# Patient Record
Sex: Female | Born: 1975 | Race: Black or African American | Hispanic: No | Marital: Married | Smoking: Current every day smoker
Health system: Southern US, Community
[De-identification: ages and names within clinical notes are randomized; demographics above are authoritative.]

## PROBLEM LIST (undated history)

## (undated) HISTORY — PX: CHOLECYSTECTOMY: SHX55

## (undated) HISTORY — PX: APPENDECTOMY: SHX54

---

## 1998-05-06 ENCOUNTER — Inpatient Hospital Stay (HOSPITAL_COMMUNITY): Admission: AD | Admit: 1998-05-06 | Discharge: 1998-05-09 | Payer: Self-pay | Admitting: Obstetrics

## 1998-05-07 ENCOUNTER — Encounter: Payer: Self-pay | Admitting: Obstetrics

## 1998-05-20 ENCOUNTER — Inpatient Hospital Stay (HOSPITAL_COMMUNITY): Admission: AD | Admit: 1998-05-20 | Discharge: 1998-05-20 | Payer: Self-pay | Admitting: *Deleted

## 1998-07-08 ENCOUNTER — Ambulatory Visit (HOSPITAL_COMMUNITY): Admission: RE | Admit: 1998-07-08 | Discharge: 1998-07-08 | Payer: Self-pay | Admitting: *Deleted

## 1998-07-16 ENCOUNTER — Inpatient Hospital Stay (HOSPITAL_COMMUNITY): Admission: AD | Admit: 1998-07-16 | Discharge: 1998-07-16 | Payer: Self-pay | Admitting: Obstetrics & Gynecology

## 1998-08-19 ENCOUNTER — Inpatient Hospital Stay (HOSPITAL_COMMUNITY): Admission: AD | Admit: 1998-08-19 | Discharge: 1998-08-19 | Payer: Self-pay | Admitting: *Deleted

## 1998-09-15 ENCOUNTER — Inpatient Hospital Stay (HOSPITAL_COMMUNITY): Admission: AD | Admit: 1998-09-15 | Discharge: 1998-09-15 | Payer: Self-pay | Admitting: *Deleted

## 1998-09-16 ENCOUNTER — Inpatient Hospital Stay (HOSPITAL_COMMUNITY): Admission: AD | Admit: 1998-09-16 | Discharge: 1998-09-16 | Payer: Self-pay | Admitting: Obstetrics

## 1998-09-17 ENCOUNTER — Inpatient Hospital Stay (HOSPITAL_COMMUNITY): Admission: AD | Admit: 1998-09-17 | Discharge: 1998-09-17 | Payer: Self-pay | Admitting: Obstetrics

## 1998-10-03 ENCOUNTER — Inpatient Hospital Stay (HOSPITAL_COMMUNITY): Admission: AD | Admit: 1998-10-03 | Discharge: 1998-10-03 | Payer: Self-pay | Admitting: *Deleted

## 1998-10-19 ENCOUNTER — Inpatient Hospital Stay (HOSPITAL_COMMUNITY): Admission: AD | Admit: 1998-10-19 | Discharge: 1998-10-19 | Payer: Self-pay | Admitting: Obstetrics & Gynecology

## 1998-11-04 ENCOUNTER — Inpatient Hospital Stay (HOSPITAL_COMMUNITY): Admission: AD | Admit: 1998-11-04 | Discharge: 1998-11-06 | Payer: Self-pay | Admitting: *Deleted

## 1999-04-26 ENCOUNTER — Emergency Department (HOSPITAL_COMMUNITY): Admission: EM | Admit: 1999-04-26 | Discharge: 1999-04-26 | Payer: Self-pay | Admitting: Emergency Medicine

## 1999-06-03 ENCOUNTER — Inpatient Hospital Stay (HOSPITAL_COMMUNITY): Admission: EM | Admit: 1999-06-03 | Discharge: 1999-06-04 | Payer: Self-pay | Admitting: Emergency Medicine

## 1999-06-03 ENCOUNTER — Encounter (INDEPENDENT_AMBULATORY_CARE_PROVIDER_SITE_OTHER): Payer: Self-pay | Admitting: *Deleted

## 1999-08-16 ENCOUNTER — Emergency Department (HOSPITAL_COMMUNITY): Admission: EM | Admit: 1999-08-16 | Discharge: 1999-08-16 | Payer: Self-pay | Admitting: Emergency Medicine

## 1999-12-07 ENCOUNTER — Emergency Department (HOSPITAL_COMMUNITY): Admission: EM | Admit: 1999-12-07 | Discharge: 1999-12-07 | Payer: Self-pay | Admitting: Emergency Medicine

## 2000-07-02 ENCOUNTER — Inpatient Hospital Stay (HOSPITAL_COMMUNITY): Admission: EM | Admit: 2000-07-02 | Discharge: 2000-07-04 | Payer: Self-pay | Admitting: Emergency Medicine

## 2000-07-02 ENCOUNTER — Encounter (INDEPENDENT_AMBULATORY_CARE_PROVIDER_SITE_OTHER): Payer: Self-pay

## 2000-07-02 ENCOUNTER — Encounter: Payer: Self-pay | Admitting: Emergency Medicine

## 2002-10-16 ENCOUNTER — Emergency Department (HOSPITAL_COMMUNITY): Admission: EM | Admit: 2002-10-16 | Discharge: 2002-10-16 | Payer: Self-pay | Admitting: Emergency Medicine

## 2003-01-28 ENCOUNTER — Emergency Department (HOSPITAL_COMMUNITY): Admission: EM | Admit: 2003-01-28 | Discharge: 2003-01-28 | Payer: Self-pay | Admitting: Emergency Medicine

## 2004-01-04 ENCOUNTER — Encounter (INDEPENDENT_AMBULATORY_CARE_PROVIDER_SITE_OTHER): Payer: Self-pay | Admitting: Specialist

## 2004-01-04 ENCOUNTER — Emergency Department (HOSPITAL_COMMUNITY): Admission: EM | Admit: 2004-01-04 | Discharge: 2004-01-04 | Payer: Self-pay | Admitting: Emergency Medicine

## 2004-01-05 ENCOUNTER — Inpatient Hospital Stay (HOSPITAL_COMMUNITY): Admission: AD | Admit: 2004-01-05 | Discharge: 2004-01-05 | Payer: Self-pay | Admitting: Obstetrics

## 2004-03-15 ENCOUNTER — Ambulatory Visit (HOSPITAL_COMMUNITY): Admission: RE | Admit: 2004-03-15 | Discharge: 2004-03-15 | Payer: Self-pay | Admitting: Obstetrics & Gynecology

## 2004-05-23 ENCOUNTER — Inpatient Hospital Stay (HOSPITAL_COMMUNITY): Admission: AD | Admit: 2004-05-23 | Discharge: 2004-05-26 | Payer: Self-pay | Admitting: Obstetrics & Gynecology

## 2004-06-08 ENCOUNTER — Inpatient Hospital Stay (HOSPITAL_COMMUNITY): Admission: AD | Admit: 2004-06-08 | Discharge: 2004-06-12 | Payer: Self-pay | Admitting: *Deleted

## 2004-07-20 ENCOUNTER — Encounter (INDEPENDENT_AMBULATORY_CARE_PROVIDER_SITE_OTHER): Payer: Self-pay | Admitting: Specialist

## 2004-07-20 ENCOUNTER — Inpatient Hospital Stay (HOSPITAL_COMMUNITY): Admission: AD | Admit: 2004-07-20 | Discharge: 2004-07-23 | Payer: Self-pay | Admitting: Obstetrics

## 2005-02-10 ENCOUNTER — Emergency Department (HOSPITAL_COMMUNITY): Admission: EM | Admit: 2005-02-10 | Discharge: 2005-02-10 | Payer: Self-pay | Admitting: Emergency Medicine

## 2005-05-04 ENCOUNTER — Inpatient Hospital Stay (HOSPITAL_COMMUNITY): Admission: AD | Admit: 2005-05-04 | Discharge: 2005-05-04 | Payer: Self-pay | Admitting: Obstetrics & Gynecology

## 2005-07-06 ENCOUNTER — Ambulatory Visit (HOSPITAL_COMMUNITY): Admission: RE | Admit: 2005-07-06 | Discharge: 2005-07-06 | Payer: Self-pay | Admitting: Obstetrics & Gynecology

## 2005-09-05 ENCOUNTER — Inpatient Hospital Stay (HOSPITAL_COMMUNITY): Admission: AD | Admit: 2005-09-05 | Discharge: 2005-09-05 | Payer: Self-pay | Admitting: Obstetrics

## 2005-10-07 ENCOUNTER — Inpatient Hospital Stay (HOSPITAL_COMMUNITY): Admission: AD | Admit: 2005-10-07 | Discharge: 2005-10-09 | Payer: Self-pay | Admitting: Obstetrics

## 2006-04-30 ENCOUNTER — Emergency Department (HOSPITAL_COMMUNITY): Admission: EM | Admit: 2006-04-30 | Discharge: 2006-04-30 | Payer: Self-pay | Admitting: Emergency Medicine

## 2006-05-03 ENCOUNTER — Emergency Department (HOSPITAL_COMMUNITY): Admission: EM | Admit: 2006-05-03 | Discharge: 2006-05-03 | Payer: Self-pay | Admitting: Family Medicine

## 2006-05-05 ENCOUNTER — Emergency Department (HOSPITAL_COMMUNITY): Admission: EM | Admit: 2006-05-05 | Discharge: 2006-05-05 | Payer: Self-pay | Admitting: Emergency Medicine

## 2006-06-13 ENCOUNTER — Emergency Department (HOSPITAL_COMMUNITY): Admission: EM | Admit: 2006-06-13 | Discharge: 2006-06-13 | Payer: Self-pay | Admitting: Emergency Medicine

## 2006-06-22 ENCOUNTER — Emergency Department (HOSPITAL_COMMUNITY): Admission: EM | Admit: 2006-06-22 | Discharge: 2006-06-22 | Payer: Self-pay | Admitting: Emergency Medicine

## 2007-01-05 ENCOUNTER — Emergency Department (HOSPITAL_COMMUNITY): Admission: EM | Admit: 2007-01-05 | Discharge: 2007-01-05 | Payer: Self-pay | Admitting: Emergency Medicine

## 2007-06-17 ENCOUNTER — Emergency Department (HOSPITAL_COMMUNITY): Admission: EM | Admit: 2007-06-17 | Discharge: 2007-06-17 | Payer: Self-pay | Admitting: Emergency Medicine

## 2008-09-08 ENCOUNTER — Ambulatory Visit: Payer: Self-pay | Admitting: Family Medicine

## 2008-09-08 ENCOUNTER — Inpatient Hospital Stay (HOSPITAL_COMMUNITY): Admission: AD | Admit: 2008-09-08 | Discharge: 2008-09-08 | Payer: Self-pay | Admitting: Obstetrics & Gynecology

## 2008-09-29 ENCOUNTER — Inpatient Hospital Stay (HOSPITAL_COMMUNITY): Admission: AD | Admit: 2008-09-29 | Discharge: 2008-09-29 | Payer: Self-pay | Admitting: Family Medicine

## 2008-09-29 ENCOUNTER — Ambulatory Visit: Payer: Self-pay | Admitting: Family Medicine

## 2008-10-22 ENCOUNTER — Inpatient Hospital Stay (HOSPITAL_COMMUNITY): Admission: AD | Admit: 2008-10-22 | Discharge: 2008-10-22 | Payer: Self-pay | Admitting: Obstetrics & Gynecology

## 2008-10-22 ENCOUNTER — Ambulatory Visit: Payer: Self-pay | Admitting: Obstetrics and Gynecology

## 2008-11-18 ENCOUNTER — Inpatient Hospital Stay (HOSPITAL_COMMUNITY): Admission: AD | Admit: 2008-11-18 | Discharge: 2008-11-21 | Payer: Self-pay | Admitting: Obstetrics & Gynecology

## 2008-11-18 ENCOUNTER — Ambulatory Visit: Payer: Self-pay | Admitting: Family

## 2010-08-07 ENCOUNTER — Encounter: Payer: Self-pay | Admitting: Obstetrics

## 2010-08-07 ENCOUNTER — Encounter: Payer: Self-pay | Admitting: Obstetrics & Gynecology

## 2010-10-26 LAB — CBC
HCT: 27.4 % — ABNORMAL LOW (ref 36.0–46.0)
HCT: 32 % — ABNORMAL LOW (ref 36.0–46.0)
Hemoglobin: 10.7 g/dL — ABNORMAL LOW (ref 12.0–15.0)
Hemoglobin: 9.3 g/dL — ABNORMAL LOW (ref 12.0–15.0)
MCHC: 33.5 g/dL (ref 30.0–36.0)
MCHC: 34 g/dL (ref 30.0–36.0)
MCV: 93.2 fL (ref 78.0–100.0)
MCV: 93.7 fL (ref 78.0–100.0)
Platelets: 219 10*3/uL (ref 150–400)
Platelets: 263 10*3/uL (ref 150–400)
RBC: 2.94 MIL/uL — ABNORMAL LOW (ref 3.87–5.11)
RBC: 3.41 MIL/uL — ABNORMAL LOW (ref 3.87–5.11)
RDW: 13.8 % (ref 11.5–15.5)
RDW: 14 % (ref 11.5–15.5)
WBC: 12 10*3/uL — ABNORMAL HIGH (ref 4.0–10.5)
WBC: 12.9 10*3/uL — ABNORMAL HIGH (ref 4.0–10.5)

## 2010-10-26 LAB — RAPID HIV SCREEN (WH-MAU): Rapid HIV Screen: NONREACTIVE

## 2010-10-26 LAB — RPR: RPR Ser Ql: NONREACTIVE

## 2010-10-26 LAB — RUBELLA SCREEN: Rubella: 20 IU/mL — ABNORMAL HIGH

## 2010-10-26 LAB — TYPE AND SCREEN
ABO/RH(D): O POS
Antibody Screen: NEGATIVE

## 2010-10-26 LAB — HEPATITIS B SURFACE ANTIGEN: Hepatitis B Surface Ag: NEGATIVE

## 2010-10-27 LAB — URINALYSIS, ROUTINE W REFLEX MICROSCOPIC
Bilirubin Urine: NEGATIVE
Glucose, UA: NEGATIVE mg/dL
Ketones, ur: NEGATIVE mg/dL
Leukocytes, UA: NEGATIVE
Nitrite: NEGATIVE
Protein, ur: NEGATIVE mg/dL
Specific Gravity, Urine: 1.02 (ref 1.005–1.030)
Urobilinogen, UA: 0.2 mg/dL (ref 0.0–1.0)
pH: 6.5 (ref 5.0–8.0)

## 2010-10-27 LAB — URINE MICROSCOPIC-ADD ON

## 2010-10-28 LAB — URINALYSIS, ROUTINE W REFLEX MICROSCOPIC
Bilirubin Urine: NEGATIVE
Glucose, UA: NEGATIVE mg/dL
Hgb urine dipstick: NEGATIVE
Ketones, ur: NEGATIVE mg/dL
Nitrite: NEGATIVE
Protein, ur: NEGATIVE mg/dL
Specific Gravity, Urine: 1.01 (ref 1.005–1.030)
Urobilinogen, UA: 0.2 mg/dL (ref 0.0–1.0)
pH: 7 (ref 5.0–8.0)

## 2010-10-28 LAB — RAPID URINE DRUG SCREEN, HOSP PERFORMED
Amphetamines: NOT DETECTED
Barbiturates: NOT DETECTED
Benzodiazepines: NOT DETECTED
Cocaine: NOT DETECTED
Opiates: NOT DETECTED
Tetrahydrocannabinol: NOT DETECTED

## 2010-10-28 LAB — URINE MICROSCOPIC-ADD ON

## 2010-10-28 LAB — GC/CHLAMYDIA PROBE AMP, GENITAL: GC Probe Amp, Genital: NEGATIVE

## 2010-10-28 LAB — WET PREP, GENITAL: Clue Cells Wet Prep HPF POC: NONE SEEN

## 2010-11-02 LAB — URINALYSIS, ROUTINE W REFLEX MICROSCOPIC
Ketones, ur: NEGATIVE mg/dL
Nitrite: NEGATIVE
pH: 6.5 (ref 5.0–8.0)

## 2010-11-02 LAB — GC/CHLAMYDIA PROBE AMP, GENITAL: Chlamydia, DNA Probe: POSITIVE — AB

## 2010-11-30 NOTE — Op Note (Signed)
NAME:  TESIA, LYBRAND NO.:  [id number]   MEDICAL RECORD NO.:  [id number]          PATIENT TYPE:  INP   LOCATION:  9111                          FACILITY:  [facility]   PHYSICIAN:  Lesly Dukes, M.D. DATE OF BIRTH:  1976-03-03   DATE OF PROCEDURE:  11/20/2008  DATE OF DISCHARGE:                               OPERATIVE REPORT   PREOPERATIVE DIAGNOSIS:  Undesired fertility.   POSTOPERATIVE DIAGNOSIS:  Undesired fertility.   PROCEDURE:  Bilateral tubal sterilization with Filshie clips.   SURGEON:  Lesly Dukes, MD   ASSISTANT:  Odie Sera, DO   ANESTHESIA:  Spinal and local.   REASON FOR PROCEDURE:  Ms. Ayana Imhof is a 35 year old gravida 6,  para 6, who is status post normal spontaneous vaginal delivery 1 day  ago.  She previously requested a bilateral tubal sterilization procedure  and then consented on the chart.  She had been counseled on the risks  and benefits of this procedure to include, but not limited to bleeding,  infection, and damage to internal organs.  Additionally, she has been  counseled on the failure rate of the procedure to be approximately 1 in  5000 and the increased risk of ectopic pregnancy should the failure  occur.  The patient voiced understanding of these risks and failure  rates and desired to proceed with the procedure.   DESCRIPTION OF PROCEDURE:  The patient was taken to the operating room  where epidural anesthesia was rebolused.  She was then prepped and  draped in the usual sterile manner.  A time-out was conducted.  The  umbilicus was anesthetized with 0.25% Marcaine without epinephrine.  A  vertical incision was made through the umbilicus and extended down in  the fascia.  The fascia was then incised in the midline with a scalpel.  The fascial incision was extended superiorly and inferiorly with Mayo  scissors.  The peritoneum was entered bluntly with a Kelly clamp.  The  peritoneal opening was extended with manual  traction.  Using retractors,  the left fallopian tube was identified and grasped with Babcock clamp.  It was traced to the end.  The fimbriated end was identified.  A  moderate left hydrosalpinx was noted.  The fallopian tube was then  traced proximally and a Filshie clip was placed approximately 2 cm from  the cornum of the uterus.  Attention was then brought to the right  fallopian tube, was identified and grasped with a Babcock clamp.  It was  then traced distally until identification of the fimbriated end.  I was  then traced proximally and a Filshie clip was placed approximately 2 cm  from the cornum of the uterus.  The tubes were then returned to the  abdomen.  The fascia was then closed using 0 Vicryl in a running non-  interlocking fashion.  The skin was then closed using 4-0 Vicryl  subcuticular manner.  All sponge, instrument, and needle counts were  correct x2.  The patient tolerated the procedure well.  There are  minimal blood loss.  The patient was taken to the  PACU in good  condition.   FINDINGS:  Bilateral fallopian tubes grossly normal and left  hydrosalpinx.   SPECIMENS:  None.      Odie Sera, DO  Electronically Signed     ______________________________  Lesly Dukes, M.D.    MC/MEDQ  D:  11/20/2008  T:  11/21/2008  Job:  [id number]

## 2010-12-03 NOTE — H&P (Signed)
[facility]  Patient:    Krista Obrien                         MRN: [id number] Adm. Date:  [id number] Attending:  Arlis Porta                         History and Physical  CHIEF COMPLAINT:  Persistent and worsening right upper quadrant pain.  HISTORY OF PRESENT ILLNESS:  This is a 35 year old female who has had intermittent episodes of right upper quadrant pain for the past 2-3 weeks.  She says today it is becoming worse and is constant.  She tried Pepcid AC and even drinking some Hawaiian Punch, but this did not offer her any relief.  She subsequently presented to the emergency department for evaluation.  She did not have any nausea, vomiting, fever, or chills.  She has three children, one by cesarean section.  PAST MEDICAL HISTORY:  No chronic illnesses.  PREVIOUS OPERATIONS:  Cesarean section.  ALLERGIES:  None reported.  MEDICATIONS:  None reported.  SOCIAL HISTORY:  She smokes a pack of cigarette a day.  She denies alcohol use.  FAMILY HISTORY:  No heart disease, diabetes mellitus, hypertension, or cancer.  REVIEW OF SYSTEMS:  Cardiovascular:  No known heart disease or hypertension. Pulmonary:  No asthma or pneumonia.  GI:  No peptic ulcer disease, hepatitis, diverticulitis.  GU: No kidney stones.  Endocrine:  No thyroid disease or diabetes mellitus.  Neurologic:  No strokes or seizures.  Hematologic:  No bleeding disorders, transfusions, or sickle cell trait.  LABORATORY DATA:  Her CBC is normal as are her liver function tests.  Urine pregnancy test is negative and urinalysis is essentially unremarkable.  Abdominal ultrasound demonstrates several large gallstones in the gallbladder with early inflammatory changes.  PHYSICAL EXAMINATION:  GENERAL:  She is a thin female.  Appears to be uncomfortable, sitting up in the  bed.  VITAL SIGNS:  She is afebrile with normal blood pressure and heart rate.  HEENT:   Normocephalic, atraumatic.  EOMI.  Sclerae are clear.  NECK:  Supple without mass.  HEART:  Regular rate and rhythm with a soft murmur.  LUNGS:  Equal breath sounds.  Clear to auscultation.  ABDOMEN:  Soft with right upper quadrant tenderness and guarding.  There are no  palpable masses.  A few bowel sounds are present.  EXTREMITIES:  No edema.  IMPRESSION:  Early acute calculus cholecystitis.  PLAN:  Laparoscopic cholecystectomy.  I did explain the procedure and the risks including, but not limited to bleeding, infection, common bile duct injury, bile leak, and the risk of the anesthetic. She seems to understand all these and agrees to proceed. DD:  06/03/99 TD:  06/03/99 Job: [id number]

## 2010-12-03 NOTE — H&P (Signed)
[facility]  Patient:    Krista Obrien, Krista Obrien                        MRN: [id number] Adm. Date:  [id number] Attending:  Bonnetta Barry                         History and Physical  REFERRING PHYSICIAN:  Dr. Earlyne Iba.  REASON FOR ADMISSION:  Acute appendicitis.  BRIEF HISTORY:  Patient is a 35 year old black female who presents to the emergency department with a 12-hour history of lower abdominal pain, greater on the right than on the left.  This is associated with anorexia.  She denies nausea and vomiting.  She denies fevers or chills.  She does not recall her last normal bowel movement but denies diarrhea.  She has had no similar such illnesses.  Patient was evaluated by the emergency room physician, including pelvic exam.  She was felt to have some pelvic pain but abdominal pain in the right lower quadrant and tenderness.  Laboratory studies showed an elevated white blood cell count of 17,000 with a left shift.  CT scan of the abdomen and pelvis was equivocal but was felt to be suspicious for acute appendicitis, as read out by ______ .  General surgery was consulted.  The patient is now admitted for surgery for appendectomy.  PAST MEDICAL HISTORY:  Status post laparoscopic cholecystectomy by Dr. Adolph Pollack; status post cesarean section.  MEDICATIONS:  None.  ALLERGIES:  None known.  SOCIAL HISTORY:  Patient smokes a pack of cigarettes every three days.  She drinks alcohol on a rare occasion.  She denies illicit drug use.  She is accompanied by her father and grandfather.  REVIEW OF SYSTEMS:  Fifteen-system review gone over with the patient without significant other positives except as noted above.  FAMILY HISTORY:  Noncontributory.  PHYSICAL EXAMINATION  GENERAL:  [age]-year-old black female on a stretcher in the emergency department.  VITAL SIGNS:  Temperature 99.0, pulse 80, respirations 19, blood  pressure 119/58.  HEENT:  Normocephalic and atraumatic.  Sclerae are clear.  Mucous membranes are dry.  NECK:  Supple without masses.  Thyroid is normal without nodularity.  LUNGS:  Clear to auscultation.  CARDIAC:  Regular and rhythm without murmur.  ABDOMEN:  Soft with a few scattered bowel sounds.  There is tenderness to percussion across the entire lower abdomen.  There is tenderness to palpation across the entire abdomen, but especially in the right lower quadrant.  There is voluntary guarding.  There is no palpable mass.  There is a well-healed surgical wound in the Pfannenstiel location and also in locations consistent with laparoscopic cholecystectomy.  GENITOURINARY:  Exam is essentially normal.  EXTREMITIES:  Nontender, without edema.  NEUROLOGIC:  The patient is alert and oriented to person, place and time, without focal neurologic deficit.  LABORATORY AND X-RAY FINDINGS:  White count 17.7, hematocrit 36.6%, hemoglobin 12.7, platelet count 233,000.  Differential:  Neutrophils 83%, 11% lymphocytes, 4% monocytes.  Electrolytes are normal with the exception of a potassium of 3.4.  Liver function tests are normal.  Creatinine is normal at 0.8.  Urine pregnancy test is negative.  Lipase is normal at 5.  IMPRESSION:  Acute appendicitis.  PLAN 1. Admission to [facility]. 2. To operating room for appendectomy. 3. Routine postoperative care. DD:  07/03/00 TD:  07/03/00 Job: [id number]

## 2010-12-03 NOTE — Discharge Summary (Signed)
NAME:  Krista Obrien, ARTZ NO.:  [id number]   MEDICAL RECORD NO.:  [id number]          PATIENT TYPE:  INP   LOCATION:  9151                          FACILITY:  [facility]   PHYSICIAN:  Charles A. Clearance Coots, M.D.DATE OF BIRTH:  [birth year]   DATE OF ADMISSION:  06/08/2004  DATE OF DISCHARGE:  06/12/2004                                 DISCHARGE SUMMARY   ADMITTING DIAGNOSES:  1.  Thirty weeks' gestation.  2.  Vaginal spotting.  3.  Abdominal cramping.   DISCHARGE DIAGNOSES:  1.  Thirty weeks' gestation.  2.  Vaginal spotting.  3.  Abdominal cramping.  4.  Placenta previa, stable.  5.  Discharged home undelivered at 37 weeks' gestation in good condition.   REASON FOR ADMISSION:  A 35 year old black female, G4, P3 at 27 weeks'  gestation, known placenta previa with presentation of vaginal spotting since  the night before her presentation.  The patient also complains of cramping.  She describes her vaginal spotting as a brownish-like color at this time but  initially had bright red bleeding overnight.  She also complained of  decreased fetal movement overnight.  She had changed 3 pads on the day of  admission.   PAST MEDICAL HISTORY:  SURGERY: Appendectomy, cholecystectomy and C-section.  ILLNESSES: None.   MEDICATIONS:  1.  Darvocet-N 100.  2.  Prenatal vitamins.  3.  Iron.   ALLERGIES:  No known drug allergies.   SOCIAL HISTORY:  Single.  Father of the baby is involved.  Negative tobacco,  alcohol or recreational drug use.   PHYSICAL EXAMINATION:  GENERAL: Well-nourished, well-developed, black female  in no acute distress.  VITAL SIGNS: Temperature 98, pulse 81, respiratory rate 20, blood pressure  132/69.  HEART: Regular rate and rhythm.  LUNGS: Clear to auscultation bilaterally.  ABDOMEN: Gravid, nontender.  GENITOURINARY: Sterile speculum exam, brownish discharge noted, no active  bleeding.  Digital cervical exam was not done.  External fetal monitor  revealed a baseline fetal heart rate of 130-140 beats per minute.  No  uterine contractions noted.  Overall variability was reassuring.   ADMITTING LABORATORY VALUES:  Hemoglobin 8.6, hematocrit 26.9, white blood  cell count 10,300, platelets of 342,000.  RPR was nonreactive.   HOSPITAL COURSE:  The patient was admitted and follow-up pelvic ultrasound  was done on hospital day #2 which revealed a persistent complete placenta  previa, anterior placenta.  Otherwise ultrasound was within normal limits.  The patient continued to have no cramping or vaginal bleeding the remainder  of the hospital course and was discharged home on hospital day #4 in good  condition at 31 weeks' gestation, undelivered.   DISCHARGE DISPOSITION:  Routine written instructions per booklet were given  for discharge with placenta previa.  The patient is to call the office for a  follow-up appointment in one week.     Char   CAH/MEDQ  D:  06/12/2004  T:  06/12/2004  Job:  [id number]

## 2010-12-03 NOTE — Op Note (Signed)
[facility]  Patient:    Krista Obrien, Krista Obrien                        MRN: [id number] Proc. Date: 07/03/00 Adm. Date:  [id number] Attending:  Bonnetta Barry CC:         Phil ______________, M.D., Emergency Dept.   Operative Report  PREOPERATIVE DIAGNOSIS:  Acute appendicitis.  POSTOPERATIVE DIAGNOSES:  Acute appendicitis, pelvic inflammatory disease.  PROCEDURE:  Laparoscopic appendectomy.  SURGEON:  Velora Heckler, M.D.  ANESTHESIA:  General.  ESTIMATED BLOOD LOSS:  Minimal.  PREPARATION:  Betadine.  COMPLICATIONS:  None.  INDICATIONS:  The patient is a 35 year old black female who presents to the emergency department with a 12 hour history of lower abdominal pain, fever, and anorexia.  The patient was seen and evaluated in the emergency department and found to have an elevated white blood cell count of 17,000.  Physical exam was felt to be suspicious for acute appendicitis.  The patient was taken to CT scan where the results of scan are equivocal showing a slightly enlarged appendix and uncertain findings of whether this actually represents acute appendicitis.  The patient is therefore prepared and brought to the operating room for diagnostic laparoscopy and appendectomy.  DESCRIPTION OF PROCEDURE:  The procedure is done in operating room #1 at the [facility].  The patient is brought to the operating room and placed in the supine position on the operating room table.  Following the administration of general anesthesia, the patient is prepped and draped in the usual strict aseptic fashion.  After ascertaining that an adequate level of anesthesia had been obtained, a supraumbilical incision is made with the #15 blade.  Dissection is carried through the fascia.  The fascia is incised in the midline.  The peritoneal cavity is entered cautiously.  An 0 Vicryl pursestring suture is placed in the fascia.  A Hasson cannula is  introduced under direct vision and secured with a pursestring suture.  Abdomen is insufflated with carbon dioxide.  Laparoscope is introduced under direct vision and the abdomen explored.  There is purulent fluid scattered throughout the abdomen.  The appendix appears to be indurated and injected with serositis around the tip.  This is consistent with early acute appendicitis but no sign of perforation.  In the pelvis, there is an inflamed appearing uterus with also edematous appearing tubes.  The right ovary was moderately enlarged. There is purulent fluid in the cul-de-sac.  There are fibrinous adhesions. Representative photographs are taken for the medical record.  These findings are felt to be consistent with chronic pelvic inflammatory disease.  We proceeded with laparoscopic appendectomy.  Peritoneal reflections around the appendix were taken down with blunt dissection and hemostasis obtained with the electrocautery after placement of operative ports in the right upper quadrant and left lower quadrant.  The base of the appendix is mobilized, and a window is created.  The base of the appendix is transected with a load of the Endo GIA stapler.  Pfannenstiel mesentery is carefully dissected out.  It is then transected with a second load of the Endo GIA stapler using vascular staple cartridge.  The remaining appendiceal mesentery is divided with careful dissection and use of the electrocautery.  The appendix is placed into an EndoCatch bag and withdrawn from the peritoneal cavity carefully.  The right lower quadrant is copiously irrigated with warm saline which is evacuated. Good hemostasis is noted.  The remainder  of the abdomen is irrigated and evacuated in order to decrease the amount of purulent fluid present within the peritoneal cavity.  Ports are removed under direct vision.  Pneumoperitoneum released.  The 0 Vicryl pursestring suture is tied securely.  All three wounds are closed  with interrupted 4-0 Vicryl subcuticular sutures.  Benzoin and Steri-Strips are applied.  Sterile dressings are applied.  The patient is awakened from anesthesia and brought to the recovery room in stable condition. The patient tolerated the procedure well. DD:  07/03/00 TD:  07/03/00 Job: [id number]

## 2010-12-03 NOTE — Op Note (Signed)
NAME:  Krista Obrien, Krista Obrien NO.:  [id number]   MEDICAL RECORD NO.:  [id number]          PATIENT TYPE:  MAT   LOCATION:  MATC                          FACILITY:  [facility]   PHYSICIAN:  Roseanna Rainbow, M.D.DATE OF BIRTH:  1975/09/25   DATE OF PROCEDURE:  07/20/2004  DATE OF DISCHARGE:                                 OPERATIVE REPORT   PREOPERATIVE DIAGNOSES:  1.  Intrauterine pregnancy at 36+ weeks.  2.  History of a complete placenta previa, rule out placenta accreta.  3.  History of previous cesarean delivery.  4.  Threatened preterm labor.   POSTOPERATIVE DIAGNOSES:  1.  Intrauterine pregnancy at 36+ weeks.  2.  History of a complete placenta previa, rule out placenta accreta.  3.  History of previous cesarean delivery.  4.  Threatened preterm labor.   PROCEDURE:  Repeat low uterine flap elliptical cesarean delivery via  Pfannenstiel skin incision.   SURGEON:  Roseanna Rainbow, M.D., Bing Neighbors. Clearance Coots, M.D.   ANESTHESIA:  Spinal.   ESTIMATED BLOOD LOSS:  1 L.   COMPLICATIONS:  None.   INTRAVENOUS FLUIDS AND URINE OUTPUT:  As per anesthesiology.   PROCEDURE IN DETAIL:  The patient was taken to the operating room.  A spinal  anesthetic was then administered without difficulty.  She was then prepped  and draped in the usual sterile fashion in the dorsal supine position with a  leftward tilt.  A Pfannenstiel skin incision was then made with the scalpel  through the previous scar and carried down to the underlying fascia with the  Bovie.  The fascia was nicked in the midline.  The fascial incision was then  extended bilaterally with the curved Mayo scissors.  The superior aspect of  the fascial incision was tented up and the underlying rectus muscles  dissected off.  The inferior aspect of the fascial incision was manipulated  in a similar fashion.  The rectus muscles were separated in the midline.  The parietal peritoneum was entered.  The peritoneal  incision was then  extended superiorly and inferiorly with good visualization of the bladder.  The bladder blade was then placed.  The vesicouterine peritoneum was tented  up and entered sharply with the Metzenbaum scissors.  This incision was  extended bilaterally and the bladder flap created bluntly.  The bladder  blade was then replaced.  The lower uterine segment was incised in a  transverse fashion with the scalpel.  The infant's head was then delivered  atraumatically through the placenta.  The cord was clamped and cut.  The  infant was handed off to the waiting neonatologists.  The uterus was then  exteriorized.  The placenta was removed manually.  Anteriorly, the cleavage  plane was somewhat obscured; however, the placenta was removed in its  entirety.  The posterior wall of the lower uterine segment was noted to be  bleeding and figure-of-eight sutures were placed in this area.  Adequate  hemostasis was noted.  The uterine incision was then reapproximated in a  running interlocking fashion using 0 Monocryl.  A second imbricating layer  was then placed.  The uterus was returned to the abdomen.  The pericolic  gutters were then copious irrigated.  The pari teal peritoneum was  reapproximated in a running fashion with 2-0 Vicryl.  The fascia was  reapproximated with in a running fashion with 0 PDS.  The skin was  reapproximated in a subcuticular fashion with 3-0 Monocryl.  At the close of  the procedure, the instrument and pack counts were said to be correct x2.  2  g of cefazolin were given at cord clamp.  The patient was taken to the PACU  awake and in stable condition.     Collier Flowers  D:  07/20/2004  T:  07/20/2004  Job:  [id number]

## 2010-12-03 NOTE — Discharge Summary (Signed)
NAME:  Krista Obrien, Krista Obrien NO.:  [id number]   MEDICAL RECORD NO.:  [id number]          PATIENT TYPE:  INP   LOCATION:  9157                          FACILITY:  [facility]   PHYSICIAN:  Roseanna Rainbow, M.D.DATE OF BIRTH:  12-03-1975   DATE OF ADMISSION:  05/23/2004  DATE OF DISCHARGE:  05/26/2004                                 DISCHARGE SUMMARY   CHIEF COMPLAINT:  The patient is a 35 year old gravida 4 para 3 with an  intrauterine pregnancy at [redacted] weeks gestation complaining of vaginal  bleeding.   HISTORY OF PRESENT ILLNESS:  The patient has a known previously-diagnosed  complete placenta previa.  She denies any contractions.  She reports good  fetal movement.   PRENATAL COURSE:  Source of care [facility] with onset of care at 13 weeks.   PREGNANCY COMPLICATIONS OR RISKS:  Please see the above.   MEDICATIONS:  Prenatal vitamins.   ALLERGIES:  No known drug allergies.   PAST OBSTETRICAL AND GYNECOLOGICAL HISTORY:  She had a spontaneous vaginal  delivery in 1995, she had a cesarean delivery in 1998 for a breech  presentation, and a VBAC in 2000 with a large-for-gestational-age infant.  She also has history of trichomoniasis.   PAST MEDICAL HISTORY:  She denies.   PAST SURGICAL HISTORY:  See above.  She has had an appendectomy.   FAMILY AND SOCIAL HISTORY:  She is single.  The father of the baby is  involved.  She denies any tobacco, ethanol, or drug use.   PRENATAL LABORATORY DATA:  Blood type and Rh O positive, antibody screen  negative.  Rubella immune.  Hepatitis B surface antigen negative.  Syphilis  nonreactive.  HIV nonreactive.  One-hour GCT of 80.   PHYSICAL EXAMINATION:  VITAL SIGNS:  Stable, afebrile.  GENERAL:  Comfortable.  HEART:  Regular rate and rhythm.  LUNGS:  Clear to auscultation bilaterally.  ABDOMEN:  Gravid and nontender.  EXTREMITIES:  No clubbing, cyanosis, or edema.  NEUROLOGIC:  Alert and oriented x3.  PELVIC:  Deferred.   Fetal  heart rate baseline 140s, moderate variability, reactive, no  decelerations.   CURRENT LABORATORY AND/OR ULTRASOUND RESULTS:  An ultrasound was consistent  with a complete previa.  Estimated fetal weight growth percentile 75th to  the 90th percentile.  Cervix 3.4 cm in length via ultrasound.   ASSESSMENT:  Intrauterine pregnancy at 28 weeks with a complete previa with  vaginal bleeding, hemodynamically stable.  Fetal heart rate tracing  consistent with fetal well-being.   PLAN:  Admission with heightened surveillance.   HOSPITAL COURSE:  The patient was admitted.  The bleeding was self-limited.  She was discharged to home on hospital day #3.   DISCHARGE DIAGNOSIS:  Intrauterine pregnancy at 28 weeks with a complete  previa with third-trimester vaginal bleeding.   CONDITION:  Stable.   DIET:  Regular.   ACTIVITY:  Pelvic rest, modified bedrest.   MEDICATIONS:  Resume home medications.   DISPOSITION:  The patient was to follow up in several days in the office.     Misty Stanley   LAJ/MEDQ  D:  07/07/2004  T:  07/07/2004  Job:  [id number]

## 2010-12-03 NOTE — Discharge Summary (Signed)
NAME:  KELBIE, MORO NO.:  [id number]   MEDICAL RECORD NO.:  [id number]           PATIENT TYPE:   LOCATION:  9120                          FACILITY:  [facility]   PHYSICIAN:  Charles A. Clearance Coots, M.D.DATE OF BIRTH:  June 23, 1976   DATE OF ADMISSION:  07/20/2004  DATE OF DISCHARGE:  07/23/2004                                 DISCHARGE SUMMARY   ADMISSION DIAGNOSIS:  A 36 week's gestation, uterine contractions, complete  placenta previa.   DISCHARGE DIAGNOSIS:  A 36 week's gestation, uterine contractions, complete  placenta previa, status post recent low transverse cesarean section on  July 20, 2004 with delivery of a viable female infant at 1600, Apgar's of  8 at 1 minute and 9 at 5 minutes, weight of 2880 grams.  Mother and infant  discharged home in good condition.   REASON FOR ADMISSION:  A 35 year old, black female, G4, P3 presents at 41  weeks' gestation with uterine contractions.  Ultrasound  revealed a complete  placenta previa during the pregnancy.  The patient has an obstetrical  history of previous cesarean delivery.   PAST MEDICAL HISTORY:  Surgery:  Cesarean section, right ovarian cystectomy.  Illnesses:  None.   MEDICATIONS PRIOR TO ADMISSION:  Prenatal vitamins.   ALLERGIES:  No known drug allergies.   SOCIAL HISTORY:  Single, positive tobacco use, negative alcohol or  recreational drug use.   PRENATAL CARE:  With this pregnancy was uncomplicated except for the  placenta previa and anemia with a hemoglobin of 7.  The anemia was most  probably chronic iron deficiency.   PHYSICAL EXAMINATION:  GENERAL:  A well-developed, well-nourished, black  female in no acute distress.  She was afebrile.  VITAL SIGNS:  Were stable.  LUNGS:  Were clear to auscultation bilaterally.  HEART:  Regular rate and rhythm.  ABDOMEN:  Gravid, non-tender.  PELVIC:  Exam was omitted.   ADMITTING LABORATORY VALUES:  Hemoglobin 8.4, hematocrit 26.8, white blood  cell count  10,000 with platelets 323,000.   HOSPITAL COURSE:  The patient underwent a repeat low transverse cesarean  section on July 20, 2004.  There were no intraoperative complications.  Her postoperative course was uncomplicated and the patient did have a drop  in her hemoglobin to 5.3 but she was asymptomatic and had no clinical  hemodynamic changes that caused symptoms.  It was elected, therefore, not to  give the patient transfusion products.  Her bleeding was very stable  postoperatively also.  She was discharged home on postoperative day #3 in  good condition.   DISCHARGE LABORATORY VALUES:  Hemoglobin 5.2, hematocrit 16.7, white blood  cell count 12,100 with platelets 249,000.   DISCHARGE DISPOSITION:   DISCHARGE MEDICATIONS:  Tylox and ibuprofen was prescribed for pain.  Niferex 150 Forte was prescribed for anemia.   Routine written instructions were given to the patient for cesarean section  delivery.  The patient is to call the office for a followup appointment in  two weeks for a CBC to check for her anemia.     Char   CAH/MEDQ  D:  07/23/2004  T:  07/23/2004  Job:  [id number]

## 2011-09-21 ENCOUNTER — Emergency Department (HOSPITAL_COMMUNITY)
Admission: EM | Admit: 2011-09-21 | Discharge: 2011-09-21 | Disposition: A | Discharge status: Home / Self Care | Payer: Self-pay | Attending: Emergency Medicine | Admitting: Emergency Medicine

## 2011-09-21 ENCOUNTER — Encounter (HOSPITAL_COMMUNITY): Payer: Self-pay | Admitting: *Deleted

## 2011-09-21 DIAGNOSIS — M545 Low back pain, unspecified: Secondary | ICD-10-CM | POA: Insufficient documentation

## 2011-09-21 DIAGNOSIS — N390 Urinary tract infection, site not specified: Secondary | ICD-10-CM | POA: Insufficient documentation

## 2011-09-21 LAB — URINALYSIS, ROUTINE W REFLEX MICROSCOPIC
Bilirubin Urine: NEGATIVE
Ketones, ur: NEGATIVE mg/dL
Nitrite: NEGATIVE
Specific Gravity, Urine: 1.021 (ref 1.005–1.030)
Urobilinogen, UA: 0.2 mg/dL (ref 0.0–1.0)

## 2011-09-21 LAB — URINE MICROSCOPIC-ADD ON

## 2011-09-21 MED ORDER — CYCLOBENZAPRINE HCL 10 MG PO TABS: 10.0000 mg | ORAL_TABLET | Freq: Two times a day (BID) | ORAL | Status: AC | PRN

## 2011-09-21 MED ORDER — IBUPROFEN 800 MG PO TABS
800.0000 mg | ORAL_TABLET | Freq: Once | ORAL | Status: AC
  Administered 2011-09-21: 800 mg via ORAL
  Filled 2011-09-21: qty 1

## 2011-09-21 MED ORDER — HYDROCODONE-ACETAMINOPHEN 5-325 MG PO TABS: 1.0000 | ORAL_TABLET | ORAL | Status: AC | PRN

## 2011-09-21 MED ORDER — NITROFURANTOIN MONOHYD MACRO 100 MG PO CAPS: 100.0000 mg | ORAL_CAPSULE | Freq: Two times a day (BID) | ORAL | Status: AC

## 2011-09-21 NOTE — ED Notes (Signed)
Pt states she started to have lower back pain about 2 days ago.pt states it started out as a dull ache and has increased. Pt denies any urinary symptoms or falls

## 2011-09-21 NOTE — Discharge Instructions (Signed)
FOLLOW UP WITH YOUR DOCTOR FOR RECHECK IF SYMPTOMS PERSIST. RETURN HERE WITH ANY WORSENING SYMPTOMS OR NEW CONCERNS. TAKE MEDICATIONS AS PRESCRIBED. PUSH FLUIDS.   Back Pain, Adult Low back pain is very common. About 1 in 5 people have back pain.The cause of low back pain is rarely dangerous. The pain often gets better over time.About half of people with a sudden onset of back pain feel better in just 2 weeks. About 8 in 10 people feel better by 6 weeks.  CAUSES Some common causes of back pain include:  Strain of the muscles or ligaments supporting the spine.   Wear and tear (degeneration) of the spinal discs.   Arthritis.   Direct injury to the back.  DIAGNOSIS Most of the time, the direct cause of low back pain is not known.However, back pain can be treated effectively even when the exact cause of the pain is unknown.Answering your caregiver's questions about your overall health and symptoms is one of the most accurate ways to make sure the cause of your pain is not dangerous. If your caregiver needs more information, he or she may order lab work or imaging tests (X-rays or MRIs).However, even if imaging tests show changes in your back, this usually does not require surgery. HOME CARE INSTRUCTIONS For many people, back pain returns.Since low back pain is rarely dangerous, it is often a condition that people can learn to [facility] their own.   Remain active. It is stressful on the back to sit or stand in one place. Do not sit, drive, or stand in one place for more than 30 minutes at a time. Take short walks on level surfaces as soon as pain allows.Try to increase the length of time you walk each day.   Do not stay in bed.Resting more than 1 or 2 days can delay your recovery.   Do not avoid exercise or work.Your body is made to move.It is not dangerous to be active, even though your back may hurt.Your back will likely heal faster if you return to being active before your pain is  gone.   Pay attention to your body when you bend and lift. Many people have less discomfortwhen lifting if they bend their knees, keep the load close to their bodies,and avoid twisting. Often, the most comfortable positions are those that put less stress on your recovering back.   Find a comfortable position to sleep. Use a firm mattress and lie on your side with your knees slightly bent. If you lie on your back, put a pillow under your knees.   Only take over-the-counter or prescription medicines as directed by your caregiver. Over-the-counter medicines to reduce pain and inflammation are often the most helpful.Your caregiver may prescribe muscle relaxant drugs.These medicines help dull your pain so you can more quickly return to your normal activities and healthy exercise.   Put ice on the injured area.   Put ice in a plastic bag.   Place a towel between your skin and the bag.   Leave the ice on for 15 to 20 minutes, 3 to 4 times a day for the first 2 to 3 days. After that, ice and heat may be alternated to reduce pain and spasms.   Ask your caregiver about trying back exercises and gentle massage. This may be of some benefit.   Avoid feeling anxious or stressed.Stress increases muscle tension and can worsen back pain.It is important to recognize when you are anxious or stressed and learn ways to  manage it.Exercise is a great option.  SEEK MEDICAL CARE IF:  You have pain that is not relieved with rest or medicine.   You have pain that does not improve in 1 week.   You have new symptoms.   You are generally not feeling well.  SEEK IMMEDIATE MEDICAL CARE IF:   You have pain that radiates from your back into your legs.   You develop new bowel or bladder control problems.   You have unusual weakness or numbness in your arms or legs.   You develop nausea or vomiting.   You develop abdominal pain.   You feel faint.  Document Released: 07/04/2005 Document Revised:  06/23/2011 Document Reviewed: 11/22/2010 [facility] Patient Information 2012 [place], [place].  Urinary Tract Infection Infections of the urinary tract can start in several places. A bladder infection (cystitis), a kidney infection (pyelonephritis), and a prostate infection (prostatitis) are different types of urinary tract infections (UTIs). They usually get better if treated with medicines (antibiotics) that kill germs. Take all the medicine until it is gone. You or your child may feel better in a few days, but TAKE ALL MEDICINE or the infection may not respond and may become more difficult to treat. HOME CARE INSTRUCTIONS   Drink enough water and fluids to keep the urine clear or pale yellow. Cranberry juice is especially recommended, in addition to large amounts of water.   Avoid caffeine, tea, and carbonated beverages. They tend to irritate the bladder.   Alcohol may irritate the prostate.   Only take over-the-counter or prescription medicines for pain, discomfort, or fever as directed by your caregiver.  To prevent further infections:  Empty the bladder often. Avoid holding urine for long periods of time.   After a bowel movement, women should cleanse from front to back. Use each tissue only once.   Empty the bladder before and after sexual intercourse.  FINDING OUT THE RESULTS OF YOUR TEST Not all test results are available during your visit. If your or your child's test results are not back during the visit, make an appointment with your caregiver to find out the results. Do not assume everything is normal if you have not heard from your caregiver or the medical facility. It is important for you to follow up on all test results. SEEK MEDICAL CARE IF:   There is back pain.   Your baby is older than 3 months with a rectal temperature of 100.5 F (38.1 C) or higher for more than 1 day.   Your or your child's problems (symptoms) are no better in 3 days. Return sooner if you or your  child is getting worse.  SEEK IMMEDIATE MEDICAL CARE IF:   There is severe back pain or lower abdominal pain.   You or your child develops chills.   You have a fever.   Your baby is older than 3 months with a rectal temperature of 102 F (38.9 C) or higher.   Your baby is 29 months old or younger with a rectal temperature of 100.4 F (38 C) or higher.   There is nausea or vomiting.   There is continued burning or discomfort with urination.  MAKE SURE YOU:   Understand these instructions.   Will watch your condition.   Will get help right away if you are not doing well or get worse.  Document Released: 04/13/2005 Document Revised: 06/23/2011 Document Reviewed: 11/16/2006 [facility] Patient Information 2012 [place], [place].

## 2011-09-21 NOTE — ED Provider Notes (Signed)
History     CSN: [id number]  Arrival date & time 09/21/11  1143   First MD Initiated Contact with Patient 09/21/11 1159      Chief Complaint  Patient presents with  . Back Pain    (Consider location/radiation/quality/duration/timing/severity/associated sxs/prior treatment) Patient is a 36 y.o. female presenting with back pain. The history is provided by the patient.  Back Pain  This is a new problem. The current episode started more than 2 days ago. The problem occurs constantly. The problem has not changed since onset.The pain is associated with no known injury. The pain is present in the lumbar spine. The quality of the pain is described as aching. The pain does not radiate. The pain is moderate. The symptoms are aggravated by certain positions, twisting and bending. The pain is worse during the day. Pertinent negatives include no fever, no numbness, no headaches, no bowel incontinence, no bladder incontinence, no dysuria and no pelvic pain.    History reviewed. No pertinent past medical history.  Past Surgical History  Procedure Date  . Cholecystectomy   . Appendectomy     No family history on file.  History  Substance Use Topics  . Smoking status: Current Everyday Smoker  . Smokeless tobacco: Not on file  . Alcohol Use: Yes     occa    OB History    Grav Para Term Preterm Abortions TAB SAB Ect Mult Living                  Review of Systems  Constitutional: Negative for fever and chills.  HENT: Negative.   Respiratory: Negative.   Cardiovascular: Negative.   Gastrointestinal: Negative.  Negative for nausea and bowel incontinence.  Genitourinary: Negative for bladder incontinence, dysuria and pelvic pain.  Musculoskeletal: Positive for back pain.  Skin: Negative.   Neurological: Negative.  Negative for numbness and headaches.    Allergies  Review of patient's allergies indicates not on file.  Home Medications   Current Outpatient Rx  Name Route Sig  Dispense Refill  . IBUPROFEN 200 MG PO TABS Oral Take 200 mg by mouth every 6 (six) hours as needed.      BP 126/80  Temp(Src) 98.4 F (36.9 C) (Oral)  Resp 20  Ht 5\' 5"  (1.651 m)  Wt 160 lb (72.576 kg)  BMI 26.63 kg/m2  SpO2 100%  LMP 08/19/2011  Physical Exam  Constitutional: She appears well-developed and well-nourished.  HENT:  Head: Normocephalic.  Neck: Normal range of motion. Neck supple.  Cardiovascular: Normal rate and regular rhythm.   Pulmonary/Chest: Effort normal and breath sounds normal.  Abdominal: Soft. Bowel sounds are normal. There is no tenderness. There is no rebound and no guarding.  Genitourinary:       Mild right CVA tenderness.   Musculoskeletal: Normal range of motion.       Mild lumbar and paralumbar tenderness without swelling or discoloration.  Neurological: She is alert. She has normal reflexes. No cranial nerve deficit. Coordination normal.  Skin: Skin is warm and dry. No rash noted.  Psychiatric: She has a normal mood and affect.    ED Course  Procedures (including critical care time)   Labs Reviewed  URINALYSIS, ROUTINE W REFLEX MICROSCOPIC  PREGNANCY, URINE   No results found.   No diagnosis found.    MDM  Symptoms present as musculoskeletal but patient reports similar to previous kidney infection. No fever, urinary symptoms. Borderline UTI with reproducible back pain, clearly a musculoskeletal component. Will  treat both and refer to primary care.        Rodena Medin, PA-C 09/21/11 1343

## 2011-09-22 NOTE — ED Provider Notes (Signed)
Medical screening examination/treatment/procedure(s) were performed by non-physician practitioner and as supervising physician I was immediately available for consultation/collaboration.   Aiya Keach, MD 09/22/11 0725 

## 2012-01-15 ENCOUNTER — Encounter (HOSPITAL_COMMUNITY): Payer: Self-pay | Admitting: Emergency Medicine

## 2012-01-15 ENCOUNTER — Emergency Department (HOSPITAL_COMMUNITY)
Admission: EM | Admit: 2012-01-15 | Discharge: 2012-01-16 | Disposition: A | Discharge status: Home / Self Care | Payer: Self-pay | Attending: Emergency Medicine | Admitting: Emergency Medicine

## 2012-01-15 DIAGNOSIS — R350 Frequency of micturition: Secondary | ICD-10-CM | POA: Insufficient documentation

## 2012-01-15 DIAGNOSIS — R51 Headache: Secondary | ICD-10-CM | POA: Insufficient documentation

## 2012-01-15 DIAGNOSIS — F17200 Nicotine dependence, unspecified, uncomplicated: Secondary | ICD-10-CM | POA: Insufficient documentation

## 2012-01-15 DIAGNOSIS — IMO0001 Reserved for inherently not codable concepts without codable children: Secondary | ICD-10-CM | POA: Insufficient documentation

## 2012-01-15 DIAGNOSIS — R6883 Chills (without fever): Secondary | ICD-10-CM | POA: Insufficient documentation

## 2012-01-15 DIAGNOSIS — R231 Pallor: Secondary | ICD-10-CM | POA: Insufficient documentation

## 2012-01-15 DIAGNOSIS — N390 Urinary tract infection, site not specified: Secondary | ICD-10-CM

## 2012-01-15 DIAGNOSIS — R112 Nausea with vomiting, unspecified: Secondary | ICD-10-CM | POA: Insufficient documentation

## 2012-01-15 LAB — DIFFERENTIAL
Eosinophils Relative: 0 % (ref 0–5)
Lymphocytes Relative: 14 % (ref 12–46)
Lymphs Abs: 2 10*3/uL (ref 0.7–4.0)
Monocytes Absolute: 1.2 10*3/uL — ABNORMAL HIGH (ref 0.1–1.0)

## 2012-01-15 LAB — COMPREHENSIVE METABOLIC PANEL
ALT: 13 U/L (ref 0–35)
CO2: 22 mEq/L (ref 19–32)
Calcium: 9.8 mg/dL (ref 8.4–10.5)
Creatinine, Ser: 0.65 mg/dL (ref 0.50–1.10)
GFR calc Af Amer: >90 mL/min (ref >90)
GFR calc non Af Amer: >90 mL/min (ref >90)
Glucose, Bld: 109 mg/dL — ABNORMAL HIGH (ref 70–99)
Total Bilirubin: 0.7 mg/dL (ref 0.3–1.2)

## 2012-01-15 LAB — CBC
HCT: 46.6 % — ABNORMAL HIGH (ref 36.0–46.0)
Hemoglobin: 16.6 g/dL — ABNORMAL HIGH (ref 12.0–15.0)
MCV: 93.8 fL (ref 78.0–100.0)
RBC: 4.97 MIL/uL (ref 3.87–5.11)
WBC: 14.2 10*3/uL — ABNORMAL HIGH (ref 4.0–10.5)

## 2012-01-15 LAB — URINALYSIS, ROUTINE W REFLEX MICROSCOPIC
Bilirubin Urine: NEGATIVE
Glucose, UA: NEGATIVE mg/dL
Ketones, ur: 40 mg/dL — AB
Protein, ur: 100 mg/dL — AB

## 2012-01-15 LAB — POCT PREGNANCY, URINE: Preg Test, Ur: NEGATIVE

## 2012-01-15 LAB — URINE MICROSCOPIC-ADD ON

## 2012-01-15 MED ORDER — ONDANSETRON 4 MG PO TBDP
4.0000 mg | ORAL_TABLET | Freq: Once | ORAL | Status: AC
  Administered 2012-01-15: 4 mg via ORAL
  Filled 2012-01-15: qty 1

## 2012-01-15 MED ORDER — SODIUM CHLORIDE 0.9 % IV SOLN
Freq: Once | INTRAVENOUS | Status: AC
  Administered 2012-01-15: 23:00:00 via INTRAVENOUS

## 2012-01-15 MED ORDER — KETOROLAC TROMETHAMINE 30 MG/ML IJ SOLN
30.0000 mg | Freq: Once | INTRAMUSCULAR | Status: AC
  Administered 2012-01-15: 30 mg via INTRAVENOUS

## 2012-01-15 MED ORDER — ONDANSETRON HCL 4 MG/2ML IJ SOLN
4.0000 mg | Freq: Once | INTRAMUSCULAR | Status: AC
  Administered 2012-01-15: 4 mg via INTRAVENOUS
  Filled 2012-01-15: qty 2

## 2012-01-15 MED ORDER — KETOROLAC TROMETHAMINE 30 MG/ML IJ SOLN
30.0000 mg | Freq: Once | INTRAMUSCULAR | Status: DC
Start: 2012-01-15 — End: 2012-01-15
  Filled 2012-01-15: qty 1

## 2012-01-15 MED ORDER — SULFAMETHOXAZOLE-TMP DS 800-160 MG PO TABS
1.0000 | ORAL_TABLET | Freq: Once | ORAL | Status: AC
  Administered 2012-01-16: 1 via ORAL
  Filled 2012-01-15: qty 1

## 2012-01-15 NOTE — ED Provider Notes (Signed)
History     CSN: [id number]  Arrival date & time 01/15/12  1954   First MD Initiated Contact with Patient 01/15/12 2241      Chief Complaint  Patient presents with  . Emesis    (Consider location/radiation/quality/duration/timing/severity/associated sxs/prior treatment) HPI Comments: Patent had gradual onset N/V without diarrhea last night that had progressed to headache, mylagias  Patient is a 36 y.o. female presenting with vomiting. The history is provided by the patient.  Emesis  This is a new problem. The current episode started yesterday. The problem occurs 5 to 10 times per day. The problem has not changed since onset.The emesis has an appearance of bilious material. There has been no fever. Associated symptoms include chills, headaches and myalgias. Pertinent negatives include no abdominal pain and no fever.    History reviewed. No pertinent past medical history.  Past Surgical History  Procedure Date  . Cholecystectomy   . Appendectomy     No family history on file.  History  Substance Use Topics  . Smoking status: Current Everyday Smoker  . Smokeless tobacco: Not on file  . Alcohol Use: Yes     occa    OB History    Grav Para Term Preterm Abortions TAB SAB Ect Mult Living                  Review of Systems  Constitutional: Positive for chills. Negative for fever.  HENT: Negative for congestion.   Respiratory: Negative for shortness of breath.   Gastrointestinal: Positive for nausea and vomiting. Negative for abdominal pain.  Genitourinary: Positive for frequency. Negative for dysuria, flank pain, vaginal bleeding and vaginal discharge.  Musculoskeletal: Positive for myalgias.  Skin: Positive for pallor.  Neurological: Positive for headaches. Negative for dizziness and weakness.    Allergies  Review of patient's allergies indicates no known allergies.  Home Medications  No current outpatient prescriptions on file.  BP 167/95  Pulse 63  Temp 99  F (37.2 C) (Oral)  Resp 18  Ht 5\' 5"  (1.651 m)  Wt 160 lb (72.576 kg)  BMI 26.63 kg/m2  SpO2 100%  LMP 12/28/2011  Physical Exam  Constitutional: She is oriented to person, place, and time. She appears well-developed and well-nourished.  HENT:  Head: Normocephalic.  Eyes: Pupils are equal, round, and reactive to light.  Cardiovascular: Normal rate.   Pulmonary/Chest: Effort normal.  Abdominal: Soft.  Musculoskeletal: Normal range of motion.  Neurological: She is alert and oriented to person, place, and time.  Skin: Skin is warm and dry. There is pallor.    ED Course  Procedures (including critical care time)  Labs Reviewed  CBC - Abnormal; Notable for the following:    WBC 14.2 (*)     Hemoglobin 16.6 (*)     HCT 46.6 (*)     All other components within normal limits  DIFFERENTIAL - Abnormal; Notable for the following:    Neutro Abs 11.0 (*)     Monocytes Absolute 1.2 (*)     All other components within normal limits  COMPREHENSIVE METABOLIC PANEL - Abnormal; Notable for the following:    Sodium 132 (*)     Glucose, Bld 109 (*)     All other components within normal limits  POCT PREGNANCY, URINE  PREGNANCY, URINE  URINALYSIS, ROUTINE W REFLEX MICROSCOPIC  CBC   No results found.   No diagnosis found.    MDM  Suspect UTI        Dondra Spry  Wynona Luna, NP 01/16/12 [phone]

## 2012-01-15 NOTE — ED Notes (Signed)
Pt c/o multiple times onset last night, too numerous to count per pt. Pt tried pepto earlier to day.

## 2012-01-16 MED ORDER — PROMETHAZINE HCL 25 MG PO TABS: 12.5000 mg | ORAL_TABLET | Freq: Four times a day (QID) | ORAL | Status: DC | PRN

## 2012-01-16 MED ORDER — SULFAMETHOXAZOLE-TMP DS 800-160 MG PO TABS: 1.0000 | ORAL_TABLET | Freq: Once | ORAL | Status: AC

## 2012-01-16 NOTE — Discharge Instructions (Signed)
Urinary Tract Infection A urinary tract infection (UTI) is often caused by a germ (bacteria). A UTI is usually helped with medicine (antibiotics) that kills germs. Take all the medicine until it is gone. Do this even if you are feeling better. You are usually better in 7 to 10 days. HOME CARE   Drink enough water and fluids to keep your pee (urine) clear or pale yellow. Drink:   Cranberry juice.   Water.   Avoid:   Caffeine.   Tea.   Bubbly (carbonated) drinks.   Alcohol.   Only take medicine as told by your doctor.   To prevent further infections:   Pee often.   After pooping (bowel movement), women should wipe from front to back. Use each tissue only once.   Pee before and after having sex (intercourse).  Ask your doctor when your test results will be ready. Make sure you follow up and get your test results.  GET HELP RIGHT AWAY IF:   There is very bad back pain or lower belly (abdominal) pain.   You get the chills.   You have a fever.   Your baby is older than 3 months with a rectal temperature of 102 F (38.9 C) or higher.   Your baby is 3 months old or younger with a rectal temperature of 100.4 F (38 C) or higher.   You feel sick to your stomach (nauseous) or throw up (vomit).   There is continued burning with peeing.   Your problems are not better in 3 days. Return sooner if you are getting worse.  MAKE SURE YOU:   Understand these instructions.   Will watch your condition.   Will get help right away if you are not doing well or get worse.  Document Released: 12/21/2007 Document Revised: 06/23/2011 Document Reviewed: 12/21/2007 [employer] Patient Information 2012 [employer]. 

## 2012-01-16 NOTE — ED Provider Notes (Signed)
Medical screening examination/treatment/procedure(s) were performed by non-physician practitioner and as supervising physician I was immediately available for consultation/collaboration.   Celene Kras, MD 01/16/12 [phone]

## 2012-09-12 ENCOUNTER — Encounter (HOSPITAL_COMMUNITY): Payer: Self-pay | Admitting: Emergency Medicine

## 2012-09-12 ENCOUNTER — Emergency Department (HOSPITAL_COMMUNITY)
Admission: EM | Admit: 2012-09-12 | Discharge: 2012-09-12 | Disposition: A | Discharge status: Home / Self Care | Payer: Self-pay | Attending: Emergency Medicine | Admitting: Emergency Medicine

## 2012-09-12 DIAGNOSIS — R05 Cough: Secondary | ICD-10-CM | POA: Insufficient documentation

## 2012-09-12 DIAGNOSIS — H6690 Otitis media, unspecified, unspecified ear: Secondary | ICD-10-CM | POA: Insufficient documentation

## 2012-09-12 DIAGNOSIS — F17200 Nicotine dependence, unspecified, uncomplicated: Secondary | ICD-10-CM | POA: Insufficient documentation

## 2012-09-12 DIAGNOSIS — J019 Acute sinusitis, unspecified: Secondary | ICD-10-CM | POA: Insufficient documentation

## 2012-09-12 DIAGNOSIS — J029 Acute pharyngitis, unspecified: Secondary | ICD-10-CM | POA: Insufficient documentation

## 2012-09-12 DIAGNOSIS — H6691 Otitis media, unspecified, right ear: Secondary | ICD-10-CM

## 2012-09-12 DIAGNOSIS — R059 Cough, unspecified: Secondary | ICD-10-CM | POA: Insufficient documentation

## 2012-09-12 DIAGNOSIS — J3489 Other specified disorders of nose and nasal sinuses: Secondary | ICD-10-CM | POA: Insufficient documentation

## 2012-09-12 DIAGNOSIS — J329 Chronic sinusitis, unspecified: Secondary | ICD-10-CM

## 2012-09-12 MED ORDER — HYDROCODONE-ACETAMINOPHEN 5-325 MG PO TABS: 2.0000 | ORAL_TABLET | Freq: Four times a day (QID) | ORAL | Status: DC | PRN

## 2012-09-12 MED ORDER — AMOXICILLIN 500 MG PO CAPS: 500.0000 mg | ORAL_CAPSULE | Freq: Three times a day (TID) | ORAL | Status: DC

## 2012-09-12 NOTE — ED Notes (Signed)
Pt reports a 1 day hx of sore throat, l/ear pain and general muscle aches

## 2012-09-12 NOTE — ED Provider Notes (Signed)
Medical screening examination/treatment/procedure(s) were performed by non-physician practitioner and as supervising physician I was immediately available for consultation/collaboration.   Kathleena Freeman, MD 09/12/12 1441 

## 2012-09-12 NOTE — ED Provider Notes (Signed)
History     CSN: [id number]  Arrival date & time 09/12/12  1306   First MD Initiated Contact with Patient 09/12/12 1322      Chief Complaint  Patient presents with  . Sore Throat  . Otalgia    r/ear pain x 1 day    (Consider location/radiation/quality/duration/timing/severity/associated sxs/prior treatment) HPI Comments: This is a 37 year old female, who presents emergency department with chief complaint of otalgia. Patient also endorses sore throat, runny nose, and cough. Patient states that this happens almost on a yearly basis. She has tried taking Tylenol and Motrin with no relief. She denies any dizziness or vertigo. She states that her pain is moderate to severe. Nothing makes it better. She denies any fever, chest pain, shortness of breath, nausea, vomiting, diarrhea, constipation, numbness and tingling of the extremities.  The history is provided by the patient. No language interpreter was used.    History reviewed. No pertinent past medical history.  Past Surgical History  Procedure Laterality Date  . Cholecystectomy    . Appendectomy      History reviewed. No pertinent family history.  History  Substance Use Topics  . Smoking status: Current Every Day Smoker  . Smokeless tobacco: Not on file  . Alcohol Use: Yes     Comment: occa    OB History   Grav Para Term Preterm Abortions TAB SAB Ect Mult Living                  Review of Systems  All other systems reviewed and are negative.    Allergies  Review of patient's allergies indicates no known allergies.  Home Medications   Current Outpatient Rx  Name  Route  Sig  Dispense  Refill  . DM-Phenylephrine-Acetaminophen (ALKA-SELTZER PLS SINUS & COUGH) 10-5-325 MG CAPS   Oral   Take 1 tablet by mouth every 6 (six) hours as needed (cold symptoms).         . EXPIRED: promethazine (PHENERGAN) 25 MG tablet   Oral   Take 0.5 tablets (12.5 mg total) by mouth every 6 (six) hours as needed for nausea.   10  tablet   0     BP 135/82  Pulse 69  Temp(Src) 98.3 F (36.8 C) (Oral)  SpO2 98%  Physical Exam  Nursing note and vitals reviewed. Constitutional: She is oriented to person, place, and time. She appears well-developed and well-nourished.  HENT:  Head: Normocephalic and atraumatic.  Right Ear: External ear normal.  Left Ear: External ear normal.  Mouth/Throat: Oropharynx is clear and moist. No oropharyngeal exudate.  Swollen, erythematous turbinates, maxillary sinuses mildly tender to palpation, right tympanic membrane does not appear infected, but there is a moderate amount of congestion behind the TM  Eyes: Conjunctivae and EOM are normal. Pupils are equal, round, and reactive to light.  Neck: Normal range of motion. Neck supple.  Cardiovascular: Normal rate, regular rhythm and normal heart sounds.   Pulmonary/Chest: Effort normal and breath sounds normal. No respiratory distress. She has no wheezes. She has no rales. She exhibits no tenderness.  Abdominal: Soft. Bowel sounds are normal.  Musculoskeletal: Normal range of motion.  Neurological: She is alert and oriented to person, place, and time.  Skin: Skin is warm and dry.  Psychiatric: She has a normal mood and affect. Her behavior is normal. Judgment and thought content normal.    ED Course  Procedures (including critical care time)  Labs Reviewed - No data to display No results found.  1. Otitis media, right   2. Sinusitis       MDM  37 year old female with otalgia and cold-like symptoms. Patient states that she gets this yearly, and was treated with antibiotic. It does appear that she has otitis media based on the congestion behind her right tympanic membrane, however there is no signs of mastoiditis, no tenderness over the mastoid, no increased swelling or redness. I'm going to discharge the patient with amoxicillin, as well as a few pain pills. Patient understands and agrees with the plan. She is stable and ready  for discharge.       Roxy Horseman, PA-C 09/12/12 1417

## 2013-09-13 ENCOUNTER — Emergency Department (HOSPITAL_COMMUNITY)
Admission: EM | Admit: 2013-09-13 | Discharge: 2013-09-13 | Disposition: A | Discharge status: Home / Self Care | Payer: Self-pay | Attending: Emergency Medicine | Admitting: Emergency Medicine

## 2013-09-13 ENCOUNTER — Encounter (HOSPITAL_COMMUNITY): Payer: Self-pay | Admitting: Emergency Medicine

## 2013-09-13 DIAGNOSIS — B9789 Other viral agents as the cause of diseases classified elsewhere: Secondary | ICD-10-CM | POA: Insufficient documentation

## 2013-09-13 DIAGNOSIS — B349 Viral infection, unspecified: Secondary | ICD-10-CM

## 2013-09-13 DIAGNOSIS — Z791 Long term (current) use of non-steroidal anti-inflammatories (NSAID): Secondary | ICD-10-CM | POA: Insufficient documentation

## 2013-09-13 DIAGNOSIS — H6691 Otitis media, unspecified, right ear: Secondary | ICD-10-CM

## 2013-09-13 DIAGNOSIS — F17200 Nicotine dependence, unspecified, uncomplicated: Secondary | ICD-10-CM | POA: Insufficient documentation

## 2013-09-13 DIAGNOSIS — R197 Diarrhea, unspecified: Secondary | ICD-10-CM | POA: Insufficient documentation

## 2013-09-13 DIAGNOSIS — H6690 Otitis media, unspecified, unspecified ear: Secondary | ICD-10-CM | POA: Insufficient documentation

## 2013-09-13 DIAGNOSIS — IMO0001 Reserved for inherently not codable concepts without codable children: Secondary | ICD-10-CM | POA: Insufficient documentation

## 2013-09-13 MED ORDER — AMOXICILLIN 500 MG PO CAPS: 500.0000 mg | ORAL_CAPSULE | Freq: Two times a day (BID) | ORAL | Status: DC

## 2013-09-13 MED ORDER — ACETAMINOPHEN 325 MG PO TABS
650.0000 mg | ORAL_TABLET | Freq: Once | ORAL | Status: AC
  Administered 2013-09-13: 650 mg via ORAL
  Filled 2013-09-13: qty 2

## 2013-09-13 NOTE — Discharge Instructions (Signed)
Take antibiotic for full dose  Drink plenty of fluids and rest  Take Ibuprofen 600-800 mg every 6-8 hours OR Tylenol 650 mg every 4-6 hours for pain  Return to the emergency department if you develop any changing/worsening condition, fever, coughing up blood, stiff neck, difficulty breathing/swallowing, repeated vomiting, feeling like you are going to pass out, abdominal pain, severe headache, or any other concerns (please read additional information regarding your condition below)     Otitis Media, Adult Otitis media is redness, soreness, and swelling (inflammation) of the middle ear. Otitis media may be caused by allergies or, most commonly, by infection. Often it occurs as a complication of the common cold. SIGNS AND SYMPTOMS Symptoms of otitis media may include:  Earache.  Fever.  Ringing in your ear.  Headache.  Leakage of fluid from the ear. DIAGNOSIS To diagnose otitis media, your health care provider will examine your ear with an otoscope. This is an instrument that allows your health care provider to see into your ear in order to examine your eardrum. Your health care provider also will ask you questions about your symptoms. TREATMENT  Typically, otitis media resolves on its own within 3 5 days. Your health care provider may prescribe medicine to ease your symptoms of pain. If otitis media does not resolve within 5 days or is recurrent, your health care provider may prescribe antibiotic medicines if he or she suspects that a bacterial infection is the cause. HOME CARE INSTRUCTIONS   Take your medicine as directed until it is gone, even if you feel better after the first few days.  Only take over-the-counter or prescription medicines for pain, discomfort, or fever as directed by your health care provider.  Follow up with your health care provider as directed. SEEK MEDICAL CARE IF:  You have otitis media only in one ear or bleeding from your nose or both.  You notice a lump  on your neck.  You are not getting better in 3 5 days.  You feel worse instead of better. SEEK IMMEDIATE MEDICAL CARE IF:   You have pain that is not controlled with medicine.  You have swelling, redness, or pain around your ear or stiffness in your neck.  You notice that part of your face is paralyzed.  You notice that the bone behind your ear (mastoid) is tender when you touch it. MAKE SURE YOU:   Understand these instructions.  Will watch your condition.  Will get help right away if you are not doing well or get worse. Document Released: 04/08/2004 Document Revised: 04/24/2013 Document Reviewed: 01/29/2013 [facility] Patient Information 2014 [place], [place].  Upper Respiratory Infection, Adult An upper respiratory infection (URI) is also known as the common cold. It is often caused by a type of germ (virus). Colds are easily spread (contagious). You can pass it to others by kissing, coughing, sneezing, or drinking out of the same glass. Usually, you get better in 1 or 2 weeks.  HOME CARE   Only take medicine as told by your doctor.  Use a warm mist humidifier or breathe in steam from a hot shower.  Drink enough water and fluids to keep your pee (urine) clear or pale yellow.  Get plenty of rest.  Return to work when your temperature is back to normal or as told by your doctor. You may use a face mask and wash your hands to stop your cold from spreading. GET HELP RIGHT AWAY IF:   After the first few days, you  feel you are getting worse.  You have questions about your medicine.  You have chills, shortness of breath, or brown or red spit (mucus).  You have yellow or brown snot (nasal discharge) or pain in the face, especially when you bend forward.  You have a fever, puffy (swollen) neck, pain when you swallow, or white spots in the back of your throat.  You have a bad headache, ear pain, sinus pain, or chest pain.  You have a high-pitched whistling sound when you  breathe in and out (wheezing).  You have a lasting cough or cough up blood.  You have sore muscles or a stiff neck. MAKE SURE YOU:   Understand these instructions.  Will watch your condition.  Will get help right away if you are not doing well or get worse. Document Released: 12/21/2007 Document Revised: 09/26/2011 Document Reviewed: 11/08/2010 [facility] Patient Information 2014 [place], [place].   Emergency Department Resource Guide 1) Find a Doctor and Pay Out of Pocket Although you won't have to find out who is covered by your insurance plan, it is a good idea to ask around and get recommendations. You will then need to call the office and see if the doctor you have chosen will accept you as a new patient and what types of options they offer for patients who are self-pay. Some doctors offer discounts or will set up payment plans for their patients who do not have insurance, but you will need to ask so you aren't surprised when you get to your appointment.  2) Contact Your Local Health Department Not all health departments have doctors that can see patients for sick visits, but many do, so it is worth a call to see if yours does. If you don't know where your local health department is, you can check in your phone book. The CDC also has a tool to help you locate your state's health department, and many state websites also have listings of all of their local health departments.  3) Find a Walk-in Clinic If your illness is not likely to be very severe or complicated, you may want to try a walk in clinic. These are popping up all over the country in pharmacies, drugstores, and shopping centers. They're usually staffed by nurse practitioners or physician assistants that have been trained to treat common illnesses and complaints. They're usually fairly quick and inexpensive. However, if you have serious medical issues or chronic medical problems, these are probably not your best option.  No  Primary Care Doctor: - Call Health Connect at  [phone] - they can help you locate a primary care doctor that  accepts your insurance, provides certain services, etc. - Physician Referral Service- [phone]  Chronic Pain Problems: Organization         Address  Phone   Notes  [facility]  [phone] Patients need to be referred by their primary care doctor.   Medication Assistance: Organization         Address  Phone   Notes  [facility] [address] [place], [place] [phone] --Must be a resident of [place] -- Must have NO insurance coverage whatsoever (no Medicaid/ Medicare, etc.) -- The pt. MUST have a primary care doctor that directs their care regularly and follows them in the community   [facility]  [phone]   [employer]  [phone]    Agencies that provide inexpensive medical care: Organization  Address  Phone   Notes  [facility]  [phone]   [facility]    [phone]   [facility] [address] [place], [place] [address] [phone]   [facility] [address] [place] [place] [phone]   Planned Parenthood    [phone]   [facility]    [phone]   [facility]  [address], [place] Phone:  [phone], Fax:  [phone] Hours of Operation:  9 am - 6 pm, M-F.  Also accepts Medicaid/Medicare and self-pay.  [facility]  [address], [place] Phone: [phone], Fax: [phone]. Hours of Operation:  8:30 am - 5:30 pm, M-F.  Also accepts Medicaid and self-pay.  [facility] [place] [address] [place] Phone: [phone]   [facility] [address] Natasha Bence [place], [place] [phone], Ext. [phone] Mondays & Thursdays: 7-9 AM.  First 15 patients are seen on  a first come, first serve basis.    Medicaid-accepting [facility] Providers:  Organization         Address  Phone   Notes  [facility] [address], [place] [phone] Also accepts self-pay patients.  [facility] [address] Laurell Josephs [place], [place]  [phone]   [facility] [address], [place] [phone]   [facility] [address], [place] [phone]   Renaye Rakers [address], [place]   [phone] Only accepts [place] Access [place] patients after they have their name applied to their card.   Self-Pay (no insurance) in [facility]:  Organization         Address  Phone   Notes  Sickle Cell Patients, [facility] [address] [employer], [place] [phone]   [facility] [address] [place], [place] [phone]   [facility] [place]  [address], [place] [phone]   [facility]/Dr. Osei-Bonsu  [address] [place] or [address], [place] [phone] Phone number for both [place] and [place] locations is the same.  [facility] [address], [place] [phone]   [facility] [address] [place] or [address] [phone]   [facility] [address], [place] [phone], phone; [phone], fax Sees patients 1st and 3rd Saturday of every month.  Must not qualify for public or private insurance (i.e. Medicaid, Medicare, [place] Health Choice, Veterans' Benefits)  Household income should be no more than 200% of the poverty level The clinic cannot treat you if you are pregnant or think you are pregnant  Sexually transmitted diseases are not treated at the clinic.    Dental Care: Organization          Address  Phone  Notes  [facility] Department of [facility] [address] [place], [place] [phone] Accepts children up to age 45 who are enrolled in [place] or [place] Health Choice; pregnant women with a Medicaid card; and children who have applied for Medicaid or [place] Health Choice, but were declined, whose parents can pay a reduced fee at time  of service.  [facility] Department of [facility]  [address], [place] [phone] Accepts children up to age 12 who are enrolled in [place] or [place] Health Choice; pregnant women with a Medicaid card; and children who have applied for Medicaid or [place] Health Choice, but were declined, whose parents can pay a reduced fee at time of service.  [facility]  [address] [place], [place] [phone] Patients are seen by appointment only. Walk-ins are not accepted. [facility] will see patients 57 years of age and older. Monday - Tuesday (8am-5pm) Most Wednesdays (8:30-5pm) $30 per visit, cash only  [facility] Adult Dental Access PROGRAM  [address], [facility] [phone] Patients are seen by appointment only. Walk-ins are not accepted. [facility] will see patients 57 years of age and older. One Wednesday Evening (Monthly: Volunteer Based).  $30 per visit, cash only  [employer]  [phone] for adults; Children under age 41, call Graduate Pediatric Dentistry at [phone]. Children aged 32-14, please call [phone] to request a pediatric application.  Dental services are provided in all areas of dental care including fillings, crowns and bridges, complete and partial dentures, implants, gum treatment, root canals, and extractions. Preventive care is also provided. Treatment is provided to both adults and children. Patients are selected via a lottery and there is often a waiting list.   [facility] [address] [place]  [phone] [email or web address]   [facility] [address] [place], [place] [phone], Ext. [phone] Second and Fourth Thursday of each month, opens at 6:30 AM; Clinic ends at 9 AM.  Patients are seen on a first-come first-served basis, and a limited number are seen during each clinic.   [facility]  [address] Ether Griffins [place], [place] [phone]   Eligibility Requirements You must have lived in [place], [place], or [place] counties for at least the last three months.   You cannot be eligible for state or federal sponsored [place], including [employer], [place], or [employer].   You generally cannot be eligible for healthcare insurance through your employer.    How to apply: Eligibility screenings are held every Tuesday and Wednesday afternoon from 1:00 pm until 4:00 pm. You do not need an appointment for the interview!  [facility] [address] [place], [place] [phone]   [facility] Health Department  [phone]   [facility] Health Department  [phone]   [facility] Health Department  [phone]    Behavioral Health Resources in the Community: Intensive Outpatient Programs Organization         Address  Phone  Notes  [facility] [address] [place], [place] [phone]   [facility] Outpatient [address] [place], [place] [phone]   [facility] [address] [place], [place]  [phone]   [facility] [address]  [place], [place] [phone] or [phone]   Substance Abuse Resources Organization         Address  Phone  Notes  [facility]  [phone]   [facility]  [phone]   [place]  [phone]   Floydene Flock  [phone]   Residential & Outpatient Substance Abuse Program  [phone]   Psychological  Services Organization         Address  Phone  Notes  [employer] [facility]  [phone]   [facility] Services  [phone]   [facility] Mental Health [address] [place] [phone] or [phone]    Mobile Crisis Teams Organization         Address  Phone  Notes  [facility], Mobile Crisis Care Unit  [phone]   [facility]  [address] [place], [place] [phone]   Doristine Locks [address] [place] [phone]    Self-Help/Support Groups Organization         Address  Phone             Notes  [facility] [place] - variety of support groups  336- I7437963 Call for more information  Narcotics Anonymous (NA), [facility] [address], [employer] [place]  2 meetings at this location   Statistician         Address  Phone  Notes  [facility] [address],    [place]  [phone]   [facility]  [address] [place] [address], [place], [place] [phone]   [facility] [address] [place], [place] [phone] Admissions: 8am-3pm M-F  [facility] [address],    [place], [place] [phone]   [facility] [address] [place], [place], [place] [phone]   [facility] [address]  [place], [place] [phone]   [facility] - Intensive Outpatient [address] Laurell Josephs [address], [place], [place] [phone]   [facility] ([facility] [address] [place]  [place], [place] [phone] or [phone]   [facility] ([facility]) [address] [place], [place] [phone] Accepts Medicaid  [facility] [address],  [place] [phone] Substance Abuse/Addiction Treatment   [facility] Organization         Address  Phone  Notes  [facility]  [phone]   Angie Fava, PhD [address] Ervin Knack [place], [place]   [phone] or [phone]   [facility]   [address] [place], [place] [phone]   [facility] [address], [place], [place] [phone] Insurance/Medicaid/sponsorship through [facility] and Families [address]                                    [place], [place] [phone] Therapy/tele-psych/case  [facility] [address], [place] [phone]    Dr. Lolly Mustache  [phone]   [facility]  [employer] [facility] Dept. 1) [address], [place] 2) [address], [place] 3)  [address], [place] [phone]  [phone]   [facility] Child Abuse Hotline (313)006-9254 or [phone] (After Hours)

## 2013-09-13 NOTE — ED Provider Notes (Signed)
CSN: [id number]     Arrival date & time 09/13/13  1050 History   First MD Initiated Contact with Patient 09/13/13 1053     No chief complaint on file.   HPI  Krista Obrien is a 38 y.o. female with no PMH who presents to the ED for evaluation of sore throat and right ear pain.  History was provided by the patient.  Patient states she has had multiple complaints for the past 24 hours. This includes sore throat, productive cough, myalgias, diarrhea, nasal congestion, post-nasal drip, rhinorrhea, and right ear pain. Cough is productive with yellow sputum. No hemoptysis. Patient denies any chest pain, SOB, abdominal pain, emesis, dysuria, fever, chills, headache, dizziness, or lightheadedness. She has been trying Aleve, Theraflu, and Ibuprofen with no relief in her symptoms. No known sick contacts. States "I get this every year around the same time." Patient is a smoker.    History reviewed. No pertinent past medical history. Past Surgical History  Procedure Laterality Date  . Cholecystectomy    . Appendectomy     No family history on file. History  Substance Use Topics  . Smoking status: Current Every Day Smoker  . Smokeless tobacco: Not on file  . Alcohol Use: Yes     Comment: occa   OB History   Grav Para Term Preterm Abortions TAB SAB Ect Mult Living                 Review of Systems  Constitutional: Negative for fever, chills, activity change, appetite change and fatigue.  HENT: Positive for congestion, ear pain, postnasal drip, rhinorrhea, sinus pressure and sore throat. Negative for ear discharge, mouth sores, tinnitus and trouble swallowing.   Eyes: Negative for photophobia and visual disturbance.  Respiratory: Positive for cough. Negative for chest tightness, shortness of breath and wheezing.   Cardiovascular: Negative for chest pain and leg swelling.  Gastrointestinal: Positive for diarrhea. Negative for nausea, vomiting, abdominal pain and constipation.  Genitourinary:  Negative for dysuria and difficulty urinating.  Musculoskeletal: Positive for myalgias. Negative for back pain, neck pain and neck stiffness.  Skin: Negative for rash.  Neurological: Negative for dizziness, syncope, weakness, light-headedness, numbness and headaches.    Allergies  Review of patient's allergies indicates no known allergies.  Home Medications   Current Outpatient Rx  Name  Route  Sig  Dispense  Refill  . DM-Phenylephrine-Acetaminophen (ALKA-SELTZER PLS SINUS & COUGH) 10-5-325 MG CAPS   Oral   Take 1 tablet by mouth every 6 (six) hours as needed (cold symptoms).         . naproxen sodium (ANAPROX) 220 MG tablet   Oral   Take 440 mg by mouth 2 (two) times daily with a meal.         . Phenylephrine-Pheniramine-DM (THERAFLU COLD & COUGH) 05-07-19 MG PACK   Oral   Take 1 packet by mouth as needed.          BP 128/91  Pulse 90  Temp(Src) 98.8 F (37.1 C) (Oral)  Resp 17  SpO2 96%  LMP 08/13/2013  Filed Vitals:   09/13/13 1056 09/13/13 1116  BP: 128/91   Pulse: 90   Temp: 98 F (36.7 C) 98.8 F (37.1 C)  TempSrc: Oral Oral  Resp: 17   SpO2: 96%     Physical Exam  Nursing note and vitals reviewed. Constitutional: She is oriented to person, place, and time. She appears well-developed and well-nourished. No distress.  HENT:  Head: Normocephalic and  atraumatic.  Left Ear: External ear normal.  Mouth/Throat: Oropharynx is clear and moist. No oropharyngeal exudate.  Rhinorrhea and nasal congestion. Right TM is with erythema and no bulging. Sinus tenderness throughout. Left tympanic membranes gray and translucent. No mastoid or tragal tenderness bilaterally. No erythema to the posterior pharynx. Tonsils without edema or exudates. Uvula midline. No trismus. No difficulty controlling secretions.   Eyes: Conjunctivae and EOM are normal. Pupils are equal, round, and reactive to light. Right eye exhibits no discharge. Left eye exhibits no discharge.  Neck:  Normal range of motion. Neck supple.  No LAD or rigidity  Cardiovascular: Normal rate, regular rhythm and normal heart sounds.  Exam reveals no gallop and no friction rub.   No murmur heard. Pulmonary/Chest: Effort normal and breath sounds normal. No respiratory distress. She has no wheezes. She has no rales. She exhibits no tenderness.  Abdominal: Soft. She exhibits no distension. There is no tenderness.  Musculoskeletal: Normal range of motion. She exhibits no edema and no tenderness.  No LE edema or calf tenderness bilaterally  Neurological: She is oriented to person, place, and time.  Skin: Skin is warm and dry. She is not diaphoretic.    ED Course  Procedures (including critical care time) Labs Review Labs Reviewed - No data to display Imaging Review No results found.  EKG Interpretation  None  MDM   Krista Obrien is a 38 y.o. female with no PMH who presents to the ED for evaluation of sore throat and right ear pain. Etiology of multiple complaints likely due to a viral syndrome and developing otitis media. Patient afebrile and non-toxic in appearance. Sore throat likely due to PND. No exudates or evidence of a peritonsillar or retropharyngeal abscess. Lungs clear to auscultation. No respiratory distress or hypoxia. Did not feel chest x-ray was necessary at this time. Patient being treated with amoxicillin for developing OM. Instructed to follow-up with PCP to establish care with a provider. Given resources. Return precautions, discharge instructions, and follow-up was discussed with the patient before discharge.     Discharge Medication List as of 09/13/2013 11:41 AM    START taking these medications   Details  amoxicillin (AMOXIL) 500 MG capsule Take 1 capsule (500 mg total) by mouth 2 (two) times daily., Starting 09/13/2013, Until Discontinued, Print         Final impressions: 1. Otitis media, right   2. Viral syndrome      Greer Ee Rashanda Magloire PA-C           Jillyn Ledger, [place] 09/14/13 [phone]

## 2013-09-13 NOTE — ED Notes (Signed)
Pt reports sore throat and and right ear pain since yesterday with generalized body pain at 10/10. Pt reports productive cough and runny nose.

## 2013-09-16 NOTE — ED Provider Notes (Signed)
Medical screening examination/treatment/procedure(s) were performed by non-physician practitioner and as supervising physician I was immediately available for consultation/collaboration.   EKG Interpretation None        Layla MawKristen N Ward, DO 09/16/13 1258

## 2017-06-15 ENCOUNTER — Emergency Department (HOSPITAL_COMMUNITY): Payer: Self-pay

## 2017-06-15 ENCOUNTER — Encounter (HOSPITAL_COMMUNITY): Payer: Self-pay | Admitting: Emergency Medicine

## 2017-06-15 ENCOUNTER — Emergency Department (HOSPITAL_COMMUNITY)
Admission: EM | Admit: 2017-06-15 | Discharge: 2017-06-15 | Disposition: A | Discharge status: Home / Self Care | Payer: Self-pay | Attending: Emergency Medicine | Admitting: Emergency Medicine

## 2017-06-15 DIAGNOSIS — Z79899 Other long term (current) drug therapy: Secondary | ICD-10-CM | POA: Insufficient documentation

## 2017-06-15 DIAGNOSIS — M5442 Lumbago with sciatica, left side: Secondary | ICD-10-CM | POA: Insufficient documentation

## 2017-06-15 DIAGNOSIS — M5441 Lumbago with sciatica, right side: Secondary | ICD-10-CM | POA: Insufficient documentation

## 2017-06-15 DIAGNOSIS — F17200 Nicotine dependence, unspecified, uncomplicated: Secondary | ICD-10-CM | POA: Insufficient documentation

## 2017-06-15 LAB — POC URINE PREG, ED: PREG TEST UR: NEGATIVE

## 2017-06-15 MED ORDER — NAPROXEN 500 MG PO TABS
250.0000 mg | ORAL_TABLET | Freq: Once | ORAL | Status: AC
  Administered 2017-06-15: 250 mg via ORAL
  Filled 2017-06-15: qty 1

## 2017-06-15 MED ORDER — HYDROCODONE-ACETAMINOPHEN 5-325 MG PO TABS
1.0000 | ORAL_TABLET | Freq: Once | ORAL | Status: AC
Start: 2017-06-15 — End: 2017-06-15
  Administered 2017-06-15: 1 via ORAL
  Filled 2017-06-15: qty 1

## 2017-06-15 MED ORDER — CYCLOBENZAPRINE HCL 10 MG PO TABS: 10.0000 mg | ORAL_TABLET | Freq: Two times a day (BID) | ORAL | 0 refills | Status: DC | PRN

## 2017-06-15 MED ORDER — NAPROXEN 375 MG PO TABS: 375.0000 mg | ORAL_TABLET | Freq: Two times a day (BID) | ORAL | 0 refills | Status: DC

## 2017-06-15 NOTE — ED Provider Notes (Signed)
[facility]PT Provider Note   CSN: [id number] Arrival date & time: 06/15/17  1530     History   Chief Complaint Chief Complaint  Patient presents with  . Back Pain    HPI Krista Obrien is a 41 y.o. female.  HPI 41 year old African-American female with no pertinent past medical history presents to the ED for evaluation of low back pain.  Patient states the pain started approximately 2-3 days ago.  Started off as a sore back that then progressively worsened after getting up off of the couch on Sunday.  Patient denies any known injury.  Patient states that pain is worse with palpation and bending.  Patient took oxycodone for pain yesterday with some relief.  Patient denies any previous back injury. Pt denies any ha, night sweats, hx of ivdu/cancer, loss or bowel or bladder, urinary retention, saddle paresthesias, lower extremity paresthesias.  Patient is ambulatory but with a limp.  Patient denies any associated fever, chills, neck pain, lightheadedness, dizziness, urinary symptoms, vaginal symptoms, abdominal pain, nausea, emesis, lower extremity paresthesias, weakness, rash.  History reviewed. No pertinent past medical history.  There are no active problems to display for this patient.   Past Surgical History:  Procedure Laterality Date  . APPENDECTOMY    . CHOLECYSTECTOMY      OB History    No data available       Home Medications    Prior to Admission medications   Medication Sig Start Date End Date Taking? Authorizing Provider  amoxicillin (AMOXIL) 500 MG capsule Take 1 capsule (500 mg total) by mouth 2 (two) times daily. 09/13/13   Rubye Oaks, Janene Harvey, PA-C  DM-Phenylephrine-Acetaminophen (ALKA-SELTZER PLS SINUS & COUGH) 10-5-325 MG CAPS Take 1 tablet by mouth every 6 (six) hours as needed (cold symptoms).    [provider]  naproxen sodium (ANAPROX) 220 MG tablet Take 440 mg by mouth 2 (two) times daily with a meal.     [provider]  Phenylephrine-Pheniramine-DM (THERAFLU COLD & COUGH) 05-07-19 MG PACK Take 1 packet by mouth as needed.    [provider]    Family History No family history on file.  Social History Social History   Tobacco Use  . Smoking status: Current Every Day Smoker    Packs/day: 0.50  . Smokeless tobacco: Never Used  Substance Use Topics  . Alcohol use: Yes    Comment: occa  . Drug use: Yes    Types: Marijuana     Allergies   Percocet [oxycodone-acetaminophen]   Review of Systems Review of Systems  Eyes: Negative for visual disturbance.  Respiratory: Negative for shortness of breath.   Cardiovascular: Negative for chest pain.  Gastrointestinal: Negative for abdominal pain, nausea and vomiting.  Genitourinary: Negative for dysuria, flank pain, frequency, hematuria, urgency, vaginal bleeding and vaginal discharge.  Musculoskeletal: Positive for arthralgias, back pain, gait problem and myalgias. Negative for joint swelling, neck pain and neck stiffness.  Skin: Negative for color change and rash.  Neurological: Negative for dizziness, weakness, light-headedness, numbness and headaches.     Physical Exam Updated Vital Signs BP (!) 142/93 (BP Location: Right Arm)   Pulse 84   Temp 98.8 F (37.1 C) (Oral)   Resp 18   Ht 5\' 5"  (1.651 m)   LMP 06/11/2017   SpO2 100%   BMI 26.63 kg/m   Physical Exam  Constitutional: She appears well-developed and well-nourished. No distress.  HENT:  Head: Normocephalic and atraumatic.  Eyes: Right  eye exhibits no discharge. Left eye exhibits no discharge. No scleral icterus.  Neck: Normal range of motion. Neck supple.  No c spine midline tenderness. No paraspinal tenderness. No deformities or step offs noted. Full ROM. Supple. No nuchal rigidity.    Pulmonary/Chest: No respiratory distress.  Musculoskeletal: Normal range of motion.  Patient with midline L-spine tenderness and bilateral paraspinal  tenderness with palpation.  No obvious step-offs, rash, deformity, erythema, warmth, ecchymosis noted.  Patient has no midline C-spine or T-spine tenderness.  Positive straight leg raise test bilaterally.  Patient is ambulatory but with a limp.  DP pulses are 2+ bilaterally.  Sensation intact.  Cap refill is normal.  Neurological: She is alert.  Patellar reflexes are normal.  Sensation intact in all dermatomes.  Strength 5 out of 5 in lower extremities.  Does complain of pain with range of motion of the lower extremities and her back.  Skin: Skin is warm and dry. Capillary refill takes less than 2 seconds. No pallor.  Psychiatric: Her behavior is normal. Judgment and thought content normal.  Nursing note and vitals reviewed.    ED Treatments / Results  Labs (all labs ordered are listed, but only abnormal results are displayed) Labs Reviewed  POC URINE PREG, ED    EKG  EKG Interpretation None       Radiology Dg Lumbar Spine Complete  Result Date: 06/15/2017 CLINICAL DATA:  41 year old female with low back pain for 4 days. No known injury. EXAM: LUMBAR SPINE - COMPLETE 4+ VIEW COMPARISON:  None. FINDINGS: There is no evidence of lumbar spine fracture. Alignment is normal. Intervertebral disc spaces are maintained. IMPRESSION: Negative. Electronically Signed   By: Sande BrothersSerena  Chacko M.D.   On: 06/15/2017 20:58    Procedures Procedures (including critical care time)  Medications Ordered in ED Medications  HYDROcodone-acetaminophen (NORCO/VICODIN) 5-325 MG per tablet 1 tablet (not administered)  naproxen (NAPROSYN) tablet 250 mg (not administered)     Initial Impression / Assessment and Plan / ED Course  I have reviewed the triage vital signs and the nursing notes.  Pertinent labs & imaging results that were available during my care of the patient were reviewed by me and considered in my medical decision making (see chart for details).     Patient with back pain.  No  neurological deficits and normal neuro exam.  Patient can walk but states is painful.  X-ray was unremarkable.  Patient has no urinary symptoms that be consistent with UTI or pyelonephritis.  No loss of bowel or bladder control.  No concern for cauda equina.  No fever, night sweats, weight loss, h/o cancer, IVDU.  Presentation and physical exam seems consistent with sciatic pain.  Will treat with muscle relaxers and anti-inflammatories.  Discussed return precautions with patient.  RICE protocol and pain medicine indicated and discussed with patient.   Pt is hemodynamically stable, in NAD, & able to ambulate in the ED. Evaluation does not show pathology that would require ongoing emergent intervention or inpatient treatment. I explained the diagnosis to the patient. Pain has been managed & has no complaints prior to dc. Pt is comfortable with above plan and is stable for discharge at this time. All questions were answered prior to disposition. Strict return precautions for f/u to the ED were discussed. Encouraged follow up with PCP.    Final Clinical Impressions(s) / ED Diagnoses   Final diagnoses:  Acute bilateral low back pain with bilateral sciatica    ED Discharge Orders  Ordered    naproxen (NAPROSYN) 375 MG tablet  2 times daily     06/15/17 2118    cyclobenzaprine (FLEXERIL) 10 MG tablet  2 times daily PRN     06/15/17 2118       Wallace KellerLeaphart, Myalee Stengel T, PA-C 06/15/17 2125    Bethann BerkshireZammit, Joseph, MD 06/16/17 [phone]

## 2017-06-15 NOTE — Discharge Instructions (Signed)
Workup has been normal. Please take medications as prescribed and instructed.  Please take the Naproxen as prescribed for pain. Do not take any additional NSAIDs including Motrin, Aleve, Ibuprofen, Advil.  Please the the flexeril for muscle relaxation. This medication will make you drowsy so avoid situation that could place you in danger.   Follow up with your primary care doctor if symptoms are not improving may need further workup including imaging if symptoms are not improving.    SEEK IMMEDIATE MEDICAL ATTENTION IF: New numbness, tingling, weakness, or problem with the use of your arms or legs.  Severe back pain not relieved with medications.  Change in bowel or bladder control.  Urinary retention.  Numbness in your groin.  Increasing pain in any areas of the body (such as chest or abdominal pain).  Shortness of breath, dizziness or fainting.  Nausea (feeling sick to your stomach), vomiting, fever, or sweats.

## 2017-06-15 NOTE — ED Triage Notes (Signed)
Pt comes in with complaints of lower back pain that started on Sunday night after getting up off of the couch.  States it has progressively gotten worse. Took oxycodone yesterday with some relief.  Pt ambulatory with some weakness.  No changes in bowel or bladder.

## 2017-11-19 ENCOUNTER — Encounter (HOSPITAL_COMMUNITY): Payer: Self-pay

## 2017-11-19 ENCOUNTER — Other Ambulatory Visit: Payer: Self-pay

## 2017-11-19 ENCOUNTER — Emergency Department (HOSPITAL_COMMUNITY): Payer: Self-pay

## 2017-11-19 ENCOUNTER — Emergency Department (HOSPITAL_COMMUNITY)
Admission: EM | Admit: 2017-11-19 | Discharge: 2017-11-19 | Disposition: A | Discharge status: Home / Self Care | Payer: Self-pay | Attending: Emergency Medicine | Admitting: Emergency Medicine

## 2017-11-19 DIAGNOSIS — F17200 Nicotine dependence, unspecified, uncomplicated: Secondary | ICD-10-CM | POA: Insufficient documentation

## 2017-11-19 DIAGNOSIS — S92512A Displaced fracture of proximal phalanx of left lesser toe(s), initial encounter for closed fracture: Secondary | ICD-10-CM | POA: Insufficient documentation

## 2017-11-19 DIAGNOSIS — Y929 Unspecified place or not applicable: Secondary | ICD-10-CM | POA: Insufficient documentation

## 2017-11-19 DIAGNOSIS — S92502A Displaced unspecified fracture of left lesser toe(s), initial encounter for closed fracture: Secondary | ICD-10-CM

## 2017-11-19 DIAGNOSIS — Y998 Other external cause status: Secondary | ICD-10-CM | POA: Insufficient documentation

## 2017-11-19 DIAGNOSIS — Y939 Activity, unspecified: Secondary | ICD-10-CM | POA: Insufficient documentation

## 2017-11-19 DIAGNOSIS — W228XXA Striking against or struck by other objects, initial encounter: Secondary | ICD-10-CM | POA: Insufficient documentation

## 2017-11-19 MED ORDER — HYDROCODONE-ACETAMINOPHEN 5-325 MG PO TABS
1.0000 | ORAL_TABLET | Freq: Once | ORAL | Status: AC
  Administered 2017-11-19: 1 via ORAL
  Filled 2017-11-19: qty 1

## 2017-11-19 MED ORDER — HYDROCODONE-ACETAMINOPHEN 5-325 MG PO TABS: 1.0000 | ORAL_TABLET | Freq: Four times a day (QID) | ORAL | 0 refills | Status: DC | PRN

## 2017-11-19 MED ORDER — NAPROXEN 500 MG PO TABS
500.0000 mg | ORAL_TABLET | Freq: Once | ORAL | Status: AC
  Administered 2017-11-19: 500 mg via ORAL
  Filled 2017-11-19: qty 1

## 2017-11-19 MED ORDER — NAPROXEN 375 MG PO TABS: ORAL_TABLET | ORAL | 0 refills | Status: DC

## 2017-11-19 NOTE — ED Provider Notes (Signed)
[facility]-EMERGENCY DEPT Provider Note: Lowella Dell, MD, FACEP  CSN: [id number] MRN: [id number] ARRIVAL: 11/19/17 at 0039 ROOM: WA13/WA13   CHIEF COMPLAINT  Foot Injury   HISTORY OF PRESENT ILLNESS  11/19/17 5:54 AM Krista Obrien is a 42 y.o. female who opened a screen door about 8:30 PM yesterday and pulled the screen door onto her left foot.  She is having moderate to severe pain in her left fourth toe radiating to her left fifth toe.  There is ecchymosis proximal to the fourth toe and there is decreased range of motion of the left fourth and fifth toes.  Pain is worse with weightbearing, palpation or attempted movement.  There is also some associated swelling of the left fourth toe.  She denies other injury.  Consultation with the [facility] state controlled substances database reveals the patient has received no opioids in the past 2 years.   History reviewed. No pertinent past medical history.  Past Surgical History:  Procedure Laterality Date  . APPENDECTOMY    . CHOLECYSTECTOMY      History reviewed. No pertinent family history.  Social History   Tobacco Use  . Smoking status: Current Every Day Smoker    Packs/day: 0.50  . Smokeless tobacco: Never Used  Substance Use Topics  . Alcohol use: Yes    Comment: occa  . Drug use: Yes    Types: Marijuana    Prior to Admission medications   Medication Sig Start Date End Date Taking? Authorizing Provider  amoxicillin (AMOXIL) 500 MG capsule Take 1 capsule (500 mg total) by mouth 2 (two) times daily. 09/13/13   Rubye Oaks, Janene Harvey, PA-C  cyclobenzaprine (FLEXERIL) 10 MG tablet Take 1 tablet (10 mg total) by mouth 2 (two) times daily as needed. 06/15/17   Rise Mu, PA-C  DM-Phenylephrine-Acetaminophen (ALKA-SELTZER PLS SINUS & COUGH) 10-5-325 MG CAPS Take 1 tablet by mouth every 6 (six) hours as needed (cold symptoms).    [provider]  naproxen (NAPROSYN) 375 MG tablet Take 1 tablet (375 mg total) by  mouth 2 (two) times daily. 06/15/17   Rise Mu, PA-C  naproxen sodium (ANAPROX) 220 MG tablet Take 440 mg by mouth 2 (two) times daily with a meal.    [provider]  Phenylephrine-Pheniramine-DM (THERAFLU COLD & COUGH) 05-07-19 MG PACK Take 1 packet by mouth as needed.    [provider]    Allergies Percocet [oxycodone-acetaminophen]   REVIEW OF SYSTEMS  Negative except as noted here or in the History of Present Illness.   PHYSICAL EXAMINATION  Initial Vital Signs Blood pressure (!) 145/107, pulse 63, temperature 98.7 F (37.1 C), temperature source Oral, resp. rate 16, height  (1.651 m), weight 80.7 kg (178 lb), SpO2 99 %.  Examination General: Well-developed, well-nourished female in no acute distress; appearance consistent with age of record HENT: normocephalic; atraumatic Eyes: pupils equal, round and reactive to light; extraocular muscles intact Neck: supple Heart: regular rate and rhythm Lungs: clear to auscultation bilaterally Abdomen: soft; nondistended; nontender; bowel sounds present Extremities: No deformity; pulses normal; tenderness, swelling, ecchymosis and decreased range of motion of left fourth toe with decreased range of motion of left fifth toe as well Neurologic: Awake, alert and oriented; motor function intact in all extremities and symmetric; no facial droop Skin: Warm and dry Psychiatric: Normal mood and affect   RESULTS  Summary of this visit's results, reviewed by myself:   EKG Interpretation  Date/Time:    Ventricular Rate:  PR Interval:    QRS Duration:   QT Interval:    QTC Calculation:   R Axis:     Text Interpretation:        Laboratory Studies: No results found for this or any previous visit (from the past 24 hour(s)). Imaging Studies: Dg Foot Complete Left  Result Date: 11/19/2017 CLINICAL DATA:  42 year old female with left foot injury. EXAM: LEFT FOOT - COMPLETE 3+ VIEW COMPARISON:  None.  FINDINGS: Minimally displaced oblique fracture of the midportion of the proximal phalanx of the fourth digit. No other acute fracture. There is no dislocation. The soft tissues appear unremarkable. IMPRESSION: Minimally displaced oblique fracture of the proximal phalanx of the fourth digit. Electronically Signed   By: Elgie Collard M.D.   On: 11/19/2017 01:53    ED COURSE and MDM  Nursing notes and initial vitals signs, including pulse oximetry, reviewed.  Vitals:   11/19/17 0113  BP: (!) 145/107  Pulse: 63  Resp: 16  Temp: 98.7 F (37.1 C)  TempSrc: Oral  SpO2: 99%  Weight: 80.7 kg (178 lb)  Height:  (1.651 m)    PROCEDURES    ED DIAGNOSES     ICD-10-CM   1. Closed fracture of phalanx of left fourth toe, initial encounter S92.502A        Jayde Mcallister, MD 11/19/17 [phone]

## 2017-11-19 NOTE — ED Triage Notes (Signed)
Pt reports that she pulled her screen door open over her L 4th and 5th toes. She reports that she is unable to put pressure on them now. A&Ox4.

## 2019-02-19 ENCOUNTER — Emergency Department (HOSPITAL_COMMUNITY)
Admission: EM | Admit: 2019-02-19 | Discharge: 2019-02-19 | Disposition: A | Discharge status: Home / Self Care | Payer: Medicaid Other | Attending: Emergency Medicine | Admitting: Emergency Medicine

## 2019-02-19 ENCOUNTER — Other Ambulatory Visit: Payer: Self-pay

## 2019-02-19 ENCOUNTER — Emergency Department (HOSPITAL_COMMUNITY): Payer: Medicaid Other

## 2019-02-19 ENCOUNTER — Encounter (HOSPITAL_COMMUNITY): Payer: Self-pay

## 2019-02-19 DIAGNOSIS — M25511 Pain in right shoulder: Secondary | ICD-10-CM | POA: Insufficient documentation

## 2019-02-19 DIAGNOSIS — F17210 Nicotine dependence, cigarettes, uncomplicated: Secondary | ICD-10-CM | POA: Insufficient documentation

## 2019-02-19 MED ORDER — HYDROCODONE-ACETAMINOPHEN 5-325 MG PO TABS: 1.0000 | ORAL_TABLET | Freq: Four times a day (QID) | ORAL | 0 refills | Status: DC | PRN

## 2019-02-19 MED ORDER — METHOCARBAMOL 500 MG PO TABS: 500.0000 mg | ORAL_TABLET | Freq: Two times a day (BID) | ORAL | 0 refills | Status: DC

## 2019-02-19 MED ORDER — IBUPROFEN 200 MG PO TABS
600.0000 mg | ORAL_TABLET | Freq: Once | ORAL | Status: AC
  Administered 2019-02-19: 17:00:00 600 mg via ORAL
  Filled 2019-02-19: qty 3

## 2019-02-19 NOTE — ED Triage Notes (Signed)
Pt states she stretched this AM, since then has had back right shoulder pain. No relief with heat or massage. No meds PTA.

## 2019-02-19 NOTE — Discharge Instructions (Addendum)
You were seen in the ED today for right shoulder pain Your xray was negative for any breaks or dislocation Please take medication as prescribed. I have written for a short course of pain medication as well as muscle relaxers  Please follow up with orthopedist Dr. Lorin Mercy for further evaluation

## 2019-02-19 NOTE — ED Provider Notes (Signed)
[facility] Provider [facility]   CSN: [id number] Arrival date & time: 02/19/19  1411     History   Chief Complaint Chief Complaint  Patient presents with   Shoulder Pain    right    HPI Krista Obrien is a 43 y.o. female who presents to the ED with sudden onset, constant, sharp, right sided shoulder pain that began earlier this morning. Pt reports that she was stretching in bed with her arms above her head when she felt an immediate pain to her shoulder. She applied heat to the area but did not take anything for pain and after 3 hours of no resolution she came to the ED to be evaluated. Pt denies falling onto shoulder or any trauma. Her pain is worsened with her arm dropped completely down at her side or if she attempts to put her arm above her head. Denies fever, chills, swelling, redness, weakness, numbness, paresthesias, or any other associated symptoms.       History reviewed. No pertinent past medical history.  There are no active problems to display for this patient.   Past Surgical History:  Procedure Laterality Date   APPENDECTOMY     CHOLECYSTECTOMY       OB History   No obstetric history on file.      Home Medications    Prior to Admission medications   Medication Sig Start Date End Date Taking? Authorizing Provider  HYDROcodone-acetaminophen (NORCO/VICODIN) 5-325 MG tablet Take 1 tablet by mouth every 6 (six) hours as needed for severe pain. 02/19/19   Alroy Bailiff, Melanie Pellot, PA-C  methocarbamol (ROBAXIN) 500 MG tablet Take 1 tablet (500 mg total) by mouth 2 (two) times daily. 02/19/19   Alroy Bailiff, Gowri Suchan, PA-C  naproxen (NAPROSYN) 375 MG tablet Take 1 tablet twice daily as needed for toe pain. 11/19/17   Molpus, John, MD    Family History No family history on file.  Social History Social History   Tobacco Use   Smoking status: Current Every Day Smoker    Packs/day: 0.50   Smokeless tobacco: Never Used  Substance Use Topics    Alcohol use: Yes    Comment: occa   Drug use: Yes    Types: Marijuana     Allergies   Percocet [oxycodone-acetaminophen]   Review of Systems Review of Systems  Constitutional: Negative for chills and fever.  Musculoskeletal: Positive for arthralgias. Negative for joint swelling.  Skin: Negative for color change.  Neurological: Negative for weakness and numbness.     Physical Exam Updated Vital Signs BP (!) 182/88    Pulse 63    Temp 98.4 F (36.9 C) (Oral)    Resp 16    Ht 5\' 5"  (1.651 m)    Wt 72.6 kg    LMP 01/21/2019 Comment: tubal ligation 10 yrs ago per patient   SpO2 100%    BMI 26.63 kg/m   Physical Exam Vitals signs and nursing note reviewed.  Constitutional:      Appearance: She is not ill-appearing.  HENT:     Head: Normocephalic and atraumatic.  Eyes:     Conjunctiva/sclera: Conjunctivae normal.  Cardiovascular:     Rate and Rhythm: Normal rate and regular rhythm.  Pulmonary:     Effort: Pulmonary effort is normal.     Breath sounds: Normal breath sounds.  Musculoskeletal:     Comments: No obvious deformity, swelling, or ecchymosis noted to right shoulder. Tenderness to palpation along scapula more so than shoulder/glenohumeral joint.  Passive ROM limited due to pain. Active ROM intact. Strength 4/5 compared to left. Sensation intact throughout. Grip strength 5/5.   Skin:    General: Skin is warm and dry.     Coloration: Skin is not jaundiced.  Neurological:     Mental Status: She is alert.      ED Treatments / Results  Labs (all labs ordered are listed, but only abnormal results are displayed) Labs Reviewed - No data to display  EKG None  Radiology Dg Shoulder Right  Result Date: 02/19/2019 CLINICAL DATA:  Pain after stretching EXAM: RIGHT SHOULDER - 2+ VIEW COMPARISON:  None. FINDINGS: There is a slightly anteriorly rotated humeral head. No fracture seen. No complete dislocation is seen. IMPRESSION: Slightly anteriorly rotated humeral head. No  fracture or dislocation. Electronically Signed   By: Jonna ClarkBindu  Avutu M.D.   On: 02/19/2019 15:17    Procedures Procedures (including critical care time)  Medications Ordered in ED Medications  ibuprofen (ADVIL) tablet 600 mg (has no administration in time range)     Initial Impression / Assessment and Plan / ED Course  I have reviewed the triage vital signs and the nursing notes.  Pertinent labs & imaging results that were available during my care of the patient were reviewed by me and considered in my medical decision making (see chart for details).    43 year old female presenting to the ED today with complaints of atraumatic right shoulder pain. Pt was stretching when she felt immediate pain to her shoulder. X ray obtained prior to being seen - no fracture or dislocation. Per radiology read pt's humerus slightly anteriorly rotated. Believe this is how patient is holding arm due to worsening pain with complete arm drop. No obvious bony tenderness palpated. Pt more tender along rotator cuff muscles. Will have her follow up with orthopedist. Prescribed very short course of pain medication as well as muscle relaxers to help pt sleep at night. Advised to not drive a car or heavy machinery while on medication. RICE therapy also discussed. Pt is in agreement with plan at this time and stable for discharge home.        Final Clinical Impressions(s) / ED Diagnoses   Final diagnoses:  Acute pain of right shoulder    ED Discharge Orders         Ordered    methocarbamol (ROBAXIN) 500 MG tablet  2 times daily     02/19/19 1630    HYDROcodone-acetaminophen (NORCO/VICODIN) 5-325 MG tablet  Every 6 hours PRN     02/19/19 1630           Tanda RockersVenter, Jeff Mccallum, PA-C 02/19/19 1728    Derwood KaplanNanavati, Ankit, MD 02/20/19 1720

## 2019-08-15 ENCOUNTER — Other Ambulatory Visit: Payer: Self-pay

## 2019-08-15 ENCOUNTER — Ambulatory Visit
Admission: EM | Admit: 2019-08-15 | Discharge: 2019-08-15 | Disposition: A | Discharge status: Home / Self Care | Payer: Medicaid Other | Attending: Physician Assistant | Admitting: Physician Assistant

## 2019-08-15 DIAGNOSIS — M545 Low back pain, unspecified: Secondary | ICD-10-CM

## 2019-08-15 MED ORDER — TIZANIDINE HCL 4 MG PO TABS: 4.0000 mg | ORAL_TABLET | Freq: Four times a day (QID) | ORAL | 0 refills | Status: AC | PRN

## 2019-08-15 MED ORDER — KETOROLAC TROMETHAMINE 30 MG/ML IJ SOLN
30.0000 mg | Freq: Once | INTRAMUSCULAR | Status: AC
  Administered 2019-08-15: 15:00:00 30 mg via INTRAMUSCULAR

## 2019-08-15 MED ORDER — PREDNISONE 10 MG (21) PO TBPK: ORAL_TABLET | Freq: Every day | ORAL | 0 refills | Status: DC

## 2019-08-15 NOTE — Discharge Instructions (Signed)
Toradol injection in office today. Prednisone as directed. Tizanidine as needed, this can make you drowsy, so do not take if you are going to drive, operate heavy machinery, or make important decisions. Ice/heat compresses as needed. Follow up with PCP/orthopedics if symptoms worsen, changes for reevaluation. If experience numbness/tingling of the inner thighs, loss of bladder or bowel control, go to the emergency department for evaluation.

## 2019-08-15 NOTE — ED Triage Notes (Signed)
Pt c/o rt lower back pain radiating down rt leg since yesterday, worse today. Denies injury. Denies loss of bowel or bladder.

## 2019-08-15 NOTE — ED Provider Notes (Signed)
[facility]    CSN: [id number] Arrival date & time: 08/15/19  1355      History   Chief Complaint Chief Complaint  Patient presents with  . Back Pain    HPI Krista Obrien is a 44 y.o. female.   44 year old female comes in for 2 day history of right sided back pain that radiates down the right leg. Pain is constant, spasm sensation, worse with movement. Denies injury/trauma.  Denies saddle anesthesia, loss of bladder or bowel control.  Denies any strenuous activity.  Denies urinary symptoms such as frequency, dysuria, hematuria.  Denies abdominal pain, nausea, vomiting.  Denies fever, chills, body aches.  Denies history of kidney stones.  Took naproxen for 40 mg without much relief. Denies history of back problems.      History reviewed. No pertinent past medical history.  There are no problems to display for this patient.   Past Surgical History:  Procedure Laterality Date  . APPENDECTOMY    . CHOLECYSTECTOMY      OB History   No obstetric history on file.      Home Medications    Prior to Admission medications   Medication Sig Start Date End Date Taking? Authorizing Provider  predniSONE (STERAPRED UNI-PAK 21 TAB) 10 MG (21) TBPK tablet Take by mouth daily. Take 6 tabs by mouth day 1, then 5 tabs, then 4 tabs, then 3 tabs, 2 tabs, then 1 tab for the last day 08/15/19   Cathie Hoops, Airabella Barley V, PA-C  tiZANidine (ZANAFLEX) 4 MG tablet Take 1 tablet (4 mg total) by mouth every 6 (six) hours as needed for muscle spasms. 08/15/19   Belinda Fisher, PA-C    Family History History reviewed. No pertinent family history.  Social History Social History   Tobacco Use  . Smoking status: Current Every Day Smoker    Packs/day: 0.50  . Smokeless tobacco: Never Used  Substance Use Topics  . Alcohol use: Yes    Comment: occa  . Drug use: Yes    Types: Marijuana     Allergies   Percocet [oxycodone-acetaminophen]   Review of Systems Review of Systems  Reason unable to  perform ROS: See HPI as above.     Physical Exam Triage Vital Signs ED Triage Vitals [08/15/19 1409]  Enc Vitals Group     BP (!) 164/94     Pulse Rate 71     Resp 16     Temp 98.9 F (37.2 C)     Temp Source Oral     SpO2 98 %     Weight      Height      Head Circumference      Peak Flow      Pain Score 8     Pain Loc      Pain Edu?      Excl. in GC?    No data found.  Updated Vital Signs BP (!) 164/94 (BP Location: Left Arm)   Pulse 71   Temp 98.9 F (37.2 C) (Oral)   Resp 16   LMP 08/05/2019   SpO2 98%   Physical Exam Constitutional:      General: She is not in acute distress.    Appearance: She is well-developed. She is not diaphoretic.  HENT:     Head: Normocephalic and atraumatic.  Eyes:     Conjunctiva/sclera: Conjunctivae normal.     Pupils: Pupils are equal, round, and reactive to light.  Cardiovascular:  Rate and Rhythm: Normal rate and regular rhythm.     Heart sounds: Normal heart sounds. No murmur. No friction rub. No gallop.   Pulmonary:     Effort: Pulmonary effort is normal. No accessory muscle usage or respiratory distress.     Breath sounds: Normal breath sounds. No stridor. No decreased breath sounds, wheezing, rhonchi or rales.  Musculoskeletal:     Comments: Patient unable to sit still due to pain. Seems to be in less pain with standing. No erythema, warmth, contusion, rashes seen. No tenderness on palpation of the spinous processes. Tenderness to palpation of right lumbar region. No tenderness to the hip. Unable to flex back on own. Unable to lay down on exam table for hip ROM and straight leg raise. By observation, no difficulty with hip ROM.   Skin:    General: Skin is warm and dry.  Neurological:     Mental Status: She is alert and oriented to person, place, and time.      UC Treatments / Results  Labs (all labs ordered are listed, but only abnormal results are displayed) Labs Reviewed - No data to  display  EKG   Radiology No results found.  Procedures Procedures (including critical care time)  Medications Ordered in UC Medications  ketorolac (TORADOL) 30 MG/ML injection 30 mg (has no administration in time range)    Initial Impression / Assessment and Plan / UC Course  I have reviewed the triage vital signs and the nursing notes.  Pertinent labs & imaging results that were available during my care of the patient were reviewed by me and considered in my medical decision making (see chart for details).    Toradol injection in office today. Will start prednisone for possible sciatica as was unable to assess on exam. Muscle relaxant as needed. Ice/heat compresses. Return precautions given.   Final Clinical Impressions(s) / UC Diagnoses   Final diagnoses:  Acute right-sided low back pain, unspecified whether sciatica present   ED Prescriptions    Medication Sig Dispense Auth. Provider   predniSONE (STERAPRED UNI-PAK 21 TAB) 10 MG (21) TBPK tablet Take by mouth daily. Take 6 tabs by mouth day 1, then 5 tabs, then 4 tabs, then 3 tabs, 2 tabs, then 1 tab for the last day 21 tablet Harden Bramer V, PA-C   tiZANidine (ZANAFLEX) 4 MG tablet Take 1 tablet (4 mg total) by mouth every 6 (six) hours as needed for muscle spasms. 30 tablet Ok Edwards, PA-C     PDMP not reviewed this encounter.   Ok Edwards, PA-C 08/15/19 1429

## 2021-01-25 ENCOUNTER — Emergency Department (HOSPITAL_COMMUNITY)
Admission: EM | Admit: 2021-01-25 | Discharge: 2021-01-25 | Disposition: A | Discharge status: Home / Self Care | Payer: Self-pay | Attending: Emergency Medicine | Admitting: Emergency Medicine

## 2021-01-25 ENCOUNTER — Encounter (HOSPITAL_COMMUNITY): Payer: Self-pay | Admitting: Emergency Medicine

## 2021-01-25 ENCOUNTER — Emergency Department (HOSPITAL_COMMUNITY): Payer: Self-pay

## 2021-01-25 DIAGNOSIS — F17210 Nicotine dependence, cigarettes, uncomplicated: Secondary | ICD-10-CM | POA: Insufficient documentation

## 2021-01-25 DIAGNOSIS — R1030 Lower abdominal pain, unspecified: Secondary | ICD-10-CM | POA: Insufficient documentation

## 2021-01-25 DIAGNOSIS — N390 Urinary tract infection, site not specified: Secondary | ICD-10-CM

## 2021-01-25 DIAGNOSIS — I10 Essential (primary) hypertension: Secondary | ICD-10-CM

## 2021-01-25 DIAGNOSIS — R197 Diarrhea, unspecified: Secondary | ICD-10-CM | POA: Insufficient documentation

## 2021-01-25 LAB — COMPREHENSIVE METABOLIC PANEL
ALT: 18 U/L (ref 0–44)
AST: 22 U/L (ref 15–41)
Albumin: 3.9 g/dL (ref 3.5–5.0)
Alkaline Phosphatase: 44 U/L (ref 38–126)
Anion gap: 8 (ref 5–15)
BUN: 8 mg/dL (ref 6–20)
CO2: 21 mmol/L — ABNORMAL LOW (ref 22–32)
Calcium: 9.2 mg/dL (ref 8.9–10.3)
Chloride: 107 mmol/L (ref 98–111)
Creatinine, Ser: 0.71 mg/dL (ref 0.44–1.00)
GFR, Estimated: >60 mL/min (ref >60)
Glucose, Bld: 92 mg/dL (ref 70–99)
Potassium: 3.9 mmol/L (ref 3.5–5.1)
Sodium: 136 mmol/L (ref 135–145)
Total Bilirubin: 0.3 mg/dL (ref 0.3–1.2)
Total Protein: 6.9 g/dL (ref 6.5–8.1)

## 2021-01-25 LAB — CBC
HCT: 40.1 % (ref 36.0–46.0)
Hemoglobin: 13.6 g/dL (ref 12.0–15.0)
MCH: 32.7 pg (ref 26.0–34.0)
MCHC: 33.9 g/dL (ref 30.0–36.0)
MCV: 96.4 fL (ref 80.0–100.0)
Platelets: 251 10*3/uL (ref 150–400)
RBC: 4.16 MIL/uL (ref 3.87–5.11)
RDW: 14.8 % (ref 11.5–15.5)
WBC: 8.6 10*3/uL (ref 4.0–10.5)
nRBC: 0 % (ref 0.0–0.2)

## 2021-01-25 LAB — URINALYSIS, ROUTINE W REFLEX MICROSCOPIC
Bacteria, UA: NONE SEEN
Bilirubin Urine: NEGATIVE
Glucose, UA: NEGATIVE mg/dL
Hgb urine dipstick: NEGATIVE
Ketones, ur: 20 mg/dL — AB
Nitrite: NEGATIVE
Protein, ur: NEGATIVE mg/dL
Specific Gravity, Urine: 1.021 (ref 1.005–1.030)
WBC, UA: >50 WBC/hpf — ABNORMAL HIGH (ref 0–5)
pH: 5 (ref 5.0–8.0)

## 2021-01-25 LAB — LIPASE, BLOOD: Lipase: 26 U/L (ref 11–51)

## 2021-01-25 LAB — I-STAT BETA HCG BLOOD, ED (MC, WL, AP ONLY): I-stat hCG, quantitative: <5 m[IU]/mL (ref <5)

## 2021-01-25 MED ORDER — CEPHALEXIN 500 MG PO CAPS: 500.0000 mg | ORAL_CAPSULE | Freq: Four times a day (QID) | ORAL | 0 refills | Status: AC

## 2021-01-25 MED ORDER — HYOSCYAMINE SULFATE 0.125 MG SL SUBL
0.2500 mg | SUBLINGUAL_TABLET | Freq: Once | SUBLINGUAL | Status: AC
  Administered 2021-01-25: 0.25 mg via SUBLINGUAL
  Filled 2021-01-25: qty 2

## 2021-01-25 MED ORDER — CEPHALEXIN 500 MG PO CAPS
500.0000 mg | ORAL_CAPSULE | Freq: Once | ORAL | Status: AC
  Administered 2021-01-25: 500 mg via ORAL
  Filled 2021-01-25: qty 1

## 2021-01-25 MED ORDER — AMLODIPINE BESYLATE 5 MG PO TABS
5.0000 mg | ORAL_TABLET | Freq: Once | ORAL | Status: AC
  Administered 2021-01-25: 5 mg via ORAL
  Filled 2021-01-25: qty 1

## 2021-01-25 MED ORDER — SUCRALFATE 1 G PO TABS
1.0000 g | ORAL_TABLET | Freq: Three times a day (TID) | ORAL | Status: DC
  Administered 2021-01-25: 1 g via ORAL
  Filled 2021-01-25: qty 1

## 2021-01-25 MED ORDER — MORPHINE SULFATE (PF) 4 MG/ML IV SOLN
4.0000 mg | Freq: Once | INTRAVENOUS | Status: AC
  Administered 2021-01-25: 4 mg via INTRAVENOUS
  Filled 2021-01-25: qty 1

## 2021-01-25 MED ORDER — PHENAZOPYRIDINE HCL 200 MG PO TABS: 200.0000 mg | ORAL_TABLET | Freq: Three times a day (TID) | ORAL | 0 refills | Status: AC

## 2021-01-25 MED ORDER — AMLODIPINE BESYLATE 5 MG PO TABS: 5.0000 mg | ORAL_TABLET | Freq: Every day | ORAL | 0 refills | Status: AC

## 2021-01-25 NOTE — ED Triage Notes (Signed)
Patient c/o lower abdominal pain with diarrhea x3 days. Denies vomiting.

## 2021-01-25 NOTE — ED Provider Notes (Signed)
[facility]-EMERGENCY DEPT Provider Note   CSN: [id number] Arrival date & time: 01/25/21  1400     History Chief Complaint  Patient presents with   Abdominal Pain    Krista Obrien is a 45 y.o. female.  44 year old female presents with 3 days of lower abdominal pain.  Pain has been waxing and waning and associated with mild diarrhea.  Denies any fever or chills.  No emesis.  No vaginal bleeding or discharge no urinary symptoms.  No treatment use prior to arrival.  No prior history of same      History reviewed. No pertinent past medical history.  There are no problems to display for this patient.   Past Surgical History:  Procedure Laterality Date   APPENDECTOMY     CHOLECYSTECTOMY       OB History   No obstetric history on file.     No family history on file.  Social History   Tobacco Use   Smoking status: Every Day    Packs/day: 0.50    Pack years: 0.00    Types: Cigarettes   Smokeless tobacco: Never  Substance Use Topics   Alcohol use: Yes    Comment: occa   Drug use: Yes    Types: Marijuana    Home Medications Prior to Admission medications   Medication Sig Start Date End Date Taking? Authorizing Provider  predniSONE (STERAPRED UNI-PAK 21 TAB) 10 MG (21) TBPK tablet Take by mouth daily. Take 6 tabs by mouth day 1, then 5 tabs, then 4 tabs, then 3 tabs, 2 tabs, then 1 tab for the last day 08/15/19   Cathie Hoops, Amy V, PA-C  tiZANidine (ZANAFLEX) 4 MG tablet Take 1 tablet (4 mg total) by mouth every 6 (six) hours as needed for muscle spasms. 08/15/19   Belinda Fisher, PA-C    Allergies    Percocet [oxycodone-acetaminophen]  Review of Systems   Review of Systems  All other systems reviewed and are negative.  Physical Exam Updated Vital Signs BP (!) 183/106 (BP Location: Left Arm)   Pulse 62   Temp 98.4 F (36.9 C) (Oral)   Resp 16   Ht 1.651 m (5\' 5" )   Wt 83 kg   LMP 12/26/2020   SpO2 97%   BMI 30.45 kg/m   Physical  Exam Vitals and nursing note reviewed.  Constitutional:      General: She is not in acute distress.    Appearance: Normal appearance. She is well-developed. She is not toxic-appearing.  HENT:     Head: Normocephalic and atraumatic.  Eyes:     General: Lids are normal.     Conjunctiva/sclera: Conjunctivae normal.     Pupils: Pupils are equal, round, and reactive to light.  Neck:     Thyroid: No thyroid mass.     Trachea: No tracheal deviation.  Cardiovascular:     Rate and Rhythm: Normal rate and regular rhythm.     Heart sounds: Normal heart sounds. No murmur heard.   No gallop.  Pulmonary:     Effort: Pulmonary effort is normal. No respiratory distress.     Breath sounds: Normal breath sounds. No stridor. No decreased breath sounds, wheezing, rhonchi or rales.  Abdominal:     General: There is no distension.     Palpations: Abdomen is soft.     Tenderness: There is no abdominal tenderness. There is no rebound.  Musculoskeletal:        General: No tenderness. Normal  range of motion.     Cervical back: Normal range of motion and neck supple.  Skin:    General: Skin is warm and dry.     Findings: No abrasion or rash.  Neurological:     Mental Status: She is alert and oriented to person, place, and time. Mental status is at baseline.     GCS: GCS eye subscore is 4. GCS verbal subscore is 5. GCS motor subscore is 6.     Cranial Nerves: Cranial nerves are intact. No cranial nerve deficit.     Sensory: No sensory deficit.     Motor: Motor function is intact.  Psychiatric:        Attention and Perception: Attention normal.        Speech: Speech normal.        Behavior: Behavior normal.    ED Results / Procedures / Treatments   Labs (all labs ordered are listed, but only abnormal results are displayed) Labs Reviewed  COMPREHENSIVE METABOLIC PANEL - Abnormal; Notable for the following components:      Result Value   CO2 21 (*)    All other components within normal limits   LIPASE, BLOOD  CBC  URINALYSIS, ROUTINE W REFLEX MICROSCOPIC  I-STAT BETA HCG BLOOD, ED ([facility], [facility], [facility] ONLY)    EKG None  Radiology No results found.  Procedures Procedures   Medications Ordered in ED Medications - No data to display  ED Course  I have reviewed the triage vital signs and the nursing notes.  Pertinent labs & imaging results that were available during my care of the patient were reviewed by me and considered in my medical decision making (see chart for details).    MDM Rules/Calculators/A&P                          Abdominal CT consistent with cystitis.  Urinalysis positive for infection.  Blood pressure elevated here and she has no history of that.  Pain was treated.  Suspect some of this is from her discomfort for UTI.  Will start on blood pressure medication here.  Also antibiotics and have her follow-up with her doctor Final Clinical Impression(s) / ED Diagnoses Final diagnoses:  None    Rx / DC Orders ED Discharge Orders     None        Lorre Nick, MD 01/25/21 2256

## 2021-01-25 NOTE — ED Provider Notes (Deleted)
Emergency Medicine Provider Triage Evaluation Note  Krista Obrien , a 45 y.o. female  was evaluated in triage.  Pt complains of abdominal pain started 3 days ago, intermittent, had diarrhea no nausea or vomiting.  No URI-like symptoms.  See doing some blood on the toilet paper denies dark tarry stools.  Has taken Pepto..  Review of Systems  Positive: Abdominal pain diarrhea Negative: Denies chest pain, shortness of breath  Physical Exam  BP 129/78 (BP Location: Right Arm)   Pulse (!) 57   Temp 99.3 F (37.4 C) (Oral)   Resp 16   Ht 5\' 5"  (1.651 m)   Wt 83.2 kg   LMP 12/26/2020   SpO2 100%   BMI 30.53 kg/m  Gen:   Awake, no distress   Resp:  Normal effort  MSK:   Moves extremities without difficulty  Other:    Medical Decision Making  Medically screening exam initiated at 2:33 PM.  Appropriate orders placed.  02/25/2021 was informed that the remainder of the evaluation will be completed by another provider, this initial triage assessment does not replace that evaluation, and the importance of remaining in the ED until their evaluation is complete.  Patient presents with abdominal pain labwork has been ordered, patient need further work-up in the emergency department.   Bufford Buttner, PA-C 01/25/21 1434

## 2021-07-23 ENCOUNTER — Other Ambulatory Visit: Payer: Self-pay

## 2021-07-23 ENCOUNTER — Encounter (HOSPITAL_COMMUNITY): Payer: Self-pay

## 2021-07-23 ENCOUNTER — Emergency Department (HOSPITAL_COMMUNITY)
Admission: EM | Admit: 2021-07-23 | Discharge: 2021-07-23 | Disposition: A | Discharge status: Home / Self Care | Payer: Medicaid Other | Attending: Emergency Medicine | Admitting: Emergency Medicine

## 2021-07-23 DIAGNOSIS — H66002 Acute suppurative otitis media without spontaneous rupture of ear drum, left ear: Secondary | ICD-10-CM | POA: Insufficient documentation

## 2021-07-23 DIAGNOSIS — R059 Cough, unspecified: Secondary | ICD-10-CM | POA: Diagnosis present

## 2021-07-23 DIAGNOSIS — U071 COVID-19: Secondary | ICD-10-CM | POA: Insufficient documentation

## 2021-07-23 LAB — RESP PANEL BY RT-PCR (FLU A&B, COVID) ARPGX2
Influenza A by PCR: NEGATIVE
Influenza B by PCR: NEGATIVE
SARS Coronavirus 2 by RT PCR: POSITIVE — AB

## 2021-07-23 MED ORDER — ACETAMINOPHEN 325 MG PO TABS
650.0000 mg | ORAL_TABLET | Freq: Once | ORAL | Status: AC
  Administered 2021-07-23: 650 mg via ORAL
  Filled 2021-07-23: qty 2

## 2021-07-23 MED ORDER — AMOXICILLIN-POT CLAVULANATE 875-125 MG PO TABS: 1.0000 | ORAL_TABLET | Freq: Two times a day (BID) | ORAL | 0 refills | Status: DC

## 2021-07-23 MED ORDER — AMOXICILLIN-POT CLAVULANATE 875-125 MG PO TABS: 1.0000 | ORAL_TABLET | Freq: Two times a day (BID) | ORAL | 0 refills | Status: AC

## 2021-07-23 NOTE — ED Provider Notes (Addendum)
[facility]-EMERGENCY DEPT Provider Note   CSN: [id number] Arrival date & time: 07/23/21  1213     History  Chief Complaint  Patient presents with   Generalized Body Aches   Otalgia   Nasal Congestion    Krista Obrien is a 46 y.o. female.  Presents emergency department with flulike symptoms for 3 days.  She has had chills, body aches, congestion, productive cough.  She developed bilateral ear pain starting today.  She has a history of frequent ear infections.  Any chest pain, shortness of breath, abdominal pain, nausea, vomiting.   Otalgia Associated symptoms: congestion, cough and fever   Associated symptoms: no abdominal pain, no sore throat and no vomiting       Home Medications Prior to Admission medications   Medication Sig Start Date End Date Taking? Authorizing Provider  amoxicillin-clavulanate (AUGMENTIN) 875-125 MG tablet Take 1 tablet by mouth every 12 (twelve) hours for 5 days. 07/23/21 07/28/21 Yes Tommy Goostree, Finis Bud, PA-C  amLODipine (NORVASC) 5 MG tablet Take 1 tablet (5 mg total) by mouth daily. 01/25/21   Lorre Nick, MD  cephALEXin (KEFLEX) 500 MG capsule Take 1 capsule (500 mg total) by mouth 4 (four) times daily. 01/25/21   Lorre Nick, MD  phenazopyridine (PYRIDIUM) 200 MG tablet Take 1 tablet (200 mg total) by mouth 3 (three) times daily. 01/25/21   Lorre Nick, MD  predniSONE (STERAPRED UNI-PAK 21 TAB) 10 MG (21) TBPK tablet Take by mouth daily. Take 6 tabs by mouth day 1, then 5 tabs, then 4 tabs, then 3 tabs, 2 tabs, then 1 tab for the last day 08/15/19   Cathie Hoops, Amy V, PA-C  tiZANidine (ZANAFLEX) 4 MG tablet Take 1 tablet (4 mg total) by mouth every 6 (six) hours as needed for muscle spasms. 08/15/19   Belinda Fisher, PA-C      Allergies    Percocet [oxycodone-acetaminophen]    Review of Systems   Review of Systems  Constitutional:  Positive for chills, fatigue and fever.  HENT:  Positive for congestion and ear pain. Negative for sore  throat.   Respiratory:  Positive for cough. Negative for chest tightness and shortness of breath.   Cardiovascular:  Negative for chest pain.  Gastrointestinal:  Negative for abdominal pain, nausea and vomiting.  All other systems reviewed and are negative.  Physical Exam Updated Vital Signs BP (!) 134/98 (BP Location: Right Arm)    Pulse 62    Temp 98.7 F (37.1 C) (Oral)    Resp 18    Ht 5\' 5"  (1.651 m)    Wt 80.7 kg    LMP 07/03/2021 (Approximate)    SpO2 94%    BMI 29.62 kg/m  Physical Exam Vitals and nursing note reviewed.  Constitutional:      General: She is not in acute distress.    Appearance: Normal appearance. She is well-developed. She is not ill-appearing, toxic-appearing or diaphoretic.  HENT:     Head: Normocephalic and atraumatic.     Right Ear: Tympanic membrane, ear canal and external ear normal. Tympanic membrane is not perforated or erythematous.     Left Ear: Ear canal and external ear normal. Tympanic membrane is erythematous. Tympanic membrane is not perforated.     Nose: Congestion present. No nasal deformity.     Mouth/Throat:     Lips: Pink. No lesions.     Mouth: Mucous membranes are moist.     Pharynx: Oropharynx is clear. Uvula midline. No pharyngeal  swelling, oropharyngeal exudate, posterior oropharyngeal erythema or uvula swelling.     Tonsils: No tonsillar exudate or tonsillar abscesses.  Eyes:     General: Gaze aligned appropriately. No scleral icterus.       Right eye: No discharge.        Left eye: No discharge.     Conjunctiva/sclera: Conjunctivae normal.     Right eye: Right conjunctiva is not injected. No exudate or hemorrhage.    Left eye: Left conjunctiva is not injected. No exudate or hemorrhage. Cardiovascular:     Rate and Rhythm: Normal rate and regular rhythm.     Pulses: Normal pulses.          Radial pulses are 2+ on the right side and 2+ on the left side.       Dorsalis pedis pulses are 2+ on the right side and 2+ on the left side.      Heart sounds: Normal heart sounds, S1 normal and S2 normal. Heart sounds not distant. No murmur heard.   No friction rub. No gallop. No S3 or S4 sounds.  Pulmonary:     Effort: Pulmonary effort is normal. No respiratory distress.     Breath sounds: Normal breath sounds. No stridor or decreased air movement. No decreased breath sounds, wheezing, rhonchi or rales.  Abdominal:     General: Abdomen is flat. There is no distension.     Palpations: Abdomen is soft.     Tenderness: There is no abdominal tenderness. There is no guarding or rebound.  Musculoskeletal:     Right lower leg: No edema.     Left lower leg: No edema.  Skin:    General: Skin is warm and dry.  Neurological:     Mental Status: She is alert and oriented to person, place, and time.  Psychiatric:        Mood and Affect: Mood normal.        Speech: Speech normal.        Behavior: Behavior normal. Behavior is cooperative.    ED Results / Procedures / Treatments   Labs (all labs ordered are listed, but only abnormal results are displayed) Labs Reviewed  RESP PANEL BY RT-PCR (FLU A&B, COVID) ARPGX2 - Abnormal; Notable for the following components:      Result Value   SARS Coronavirus 2 by RT PCR POSITIVE (*)    All other components within normal limits    EKG None  Radiology No results found.  Procedures Procedures    Medications Ordered in ED Medications  acetaminophen (TYLENOL) tablet 650 mg (650 mg Oral Given 07/23/21 1346)    ED Course/ Medical Decision Making/ A&P                           Medical Decision Making Problems Addressed: Acute suppurative otitis media of left ear without spontaneous rupture of tympanic membrane, recurrence not specified: self-limited or minor problem COVID-19: self-limited or minor problem  Amount and/or Complexity of Data Reviewed Labs: ordered. Decision-making details documented in ED Course.  Risk OTC drugs. Prescription drug management.   This is a 46 y.o.  female who presents to the ED with flu like sx for 3 days. She has new bilateral ear pain starting today.  Exam notable for left TM erythema. No signs of TM perforation, otitis externa, or mastoiditis. Likely represents otitis media.   Patient tested positive for COVID.  Her temperature and heart rate normalized after  giving tylenol.   I do not feel that she has PNA or any respiratory complication.   She is stable to be discharged home with supportive treatment. Abx prescribed for ear infection.  Portions of this note were generated with Scientist, clinical (histocompatibility and immunogenetics). Dictation errors may occur despite best attempts at proofreading.   Final Clinical Impression(s) / ED Diagnoses Final diagnoses:  COVID-19  Acute suppurative otitis media of left ear without spontaneous rupture of tympanic membrane, recurrence not specified    Rx / DC Orders ED Discharge Orders          Ordered    amoxicillin-clavulanate (AUGMENTIN) 875-125 MG tablet  Every 12 hours        07/23/21 1715              Adams, Finis Bud, PA-C 07/23/21 1722    Claudie Leach, PA-C 07/23/21 1722    Victorino Dike Finis Bud, PA-C 07/23/21 1723    Charlynne Pander, MD 07/23/21 2217

## 2021-07-23 NOTE — Discharge Instructions (Signed)
You have tested positive for COVID-19. Please stay home for at least 5 days from symptom onset. Isolate from others in your home. You are likely most infectious during these first 5 days.  You may end isolation after day 5 if you are fever free for 24 hours AND your symptoms are improving. Please continue to wear a mask in public until day 10.   If you have?moderate illness?(if you experienced shortness of breath or had difficulty breathing), or?severe illness?(you were hospitalized) due to COVID-19, or you have a weakened immune system, you need to isolate through day 10.  Please return if you develop difficulty breathing, chest pain, or worsening symptoms after day 5, please return to the emergency department.  You have been prescribed an antibiotic for an ear infection. Please take all of your antibiotics until finished!   You may develop abdominal discomfort or diarrhea from the antibiotic.

## 2021-07-23 NOTE — ED Triage Notes (Signed)
Patient c/o generalized body aches, bilateral ear pain, nasal congestion, and chills x 3 days.

## 2022-03-14 IMAGING — CT CT ABD-PELV W/O CM
2 of 4 series · 16 of 46 positions shown, 18 images · non-contrast
Comparison: 06/22/2006

CLINICAL DATA: Lower abdominal pain for 3 days

EXAM:
CT ABDOMEN AND PELVIS WITHOUT CONTRAST
TECHNIQUE: Multidetector CT imaging of the abdomen and pelvis was performed
following the standard protocol without IV contrast.

[Series 2: axial st · axial · 0.80mm/px · z∈[-531,-101]mm · 13 of 98 slices shown, 15 images]
[im 6/98  soft-tissue]
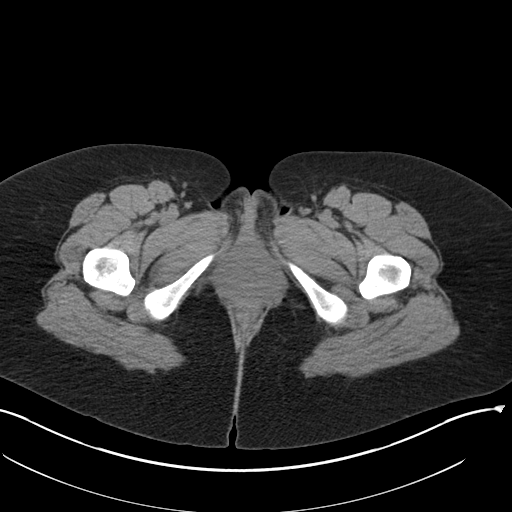
[im 6/98  bone]
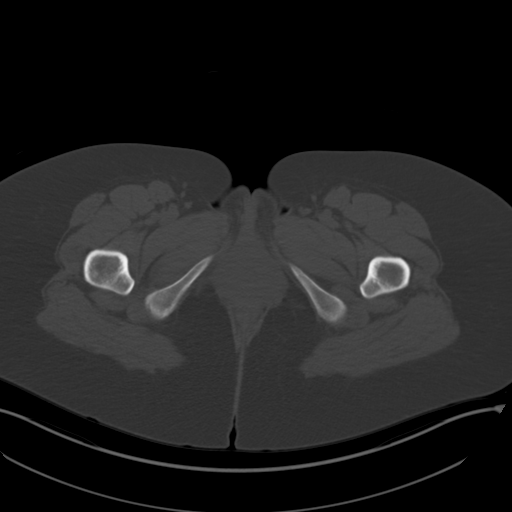
[im 11/98  soft-tissue]
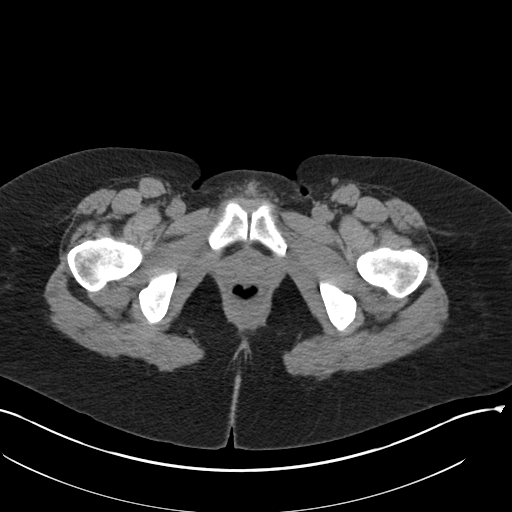
[im 22/98  soft-tissue]
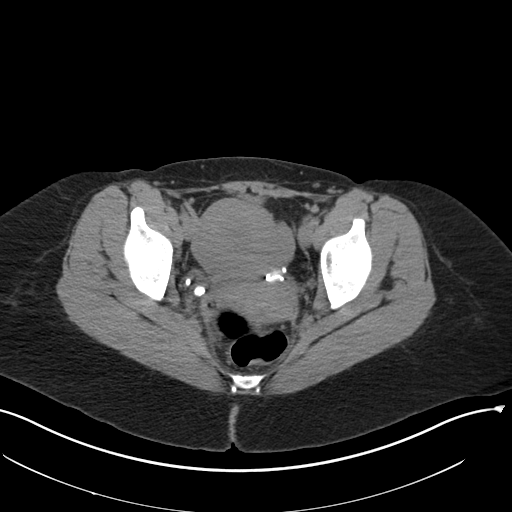
[im 27/98  soft-tissue]
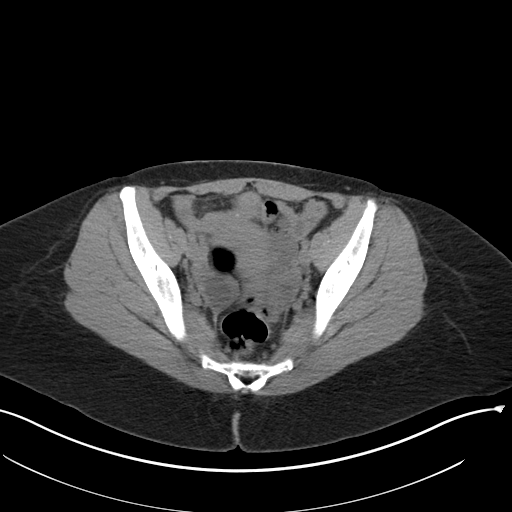
[im 33/98  soft-tissue]
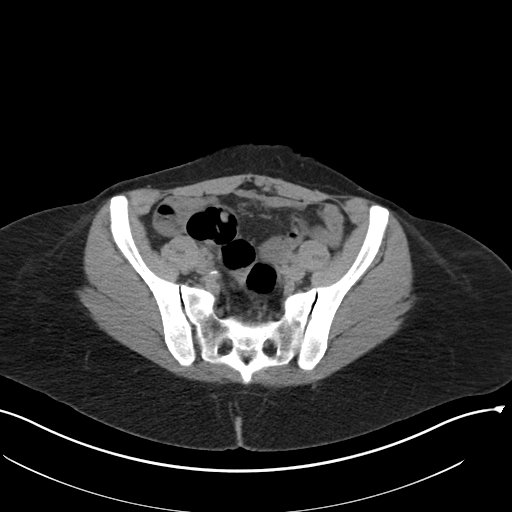
[im 44/98  soft-tissue]
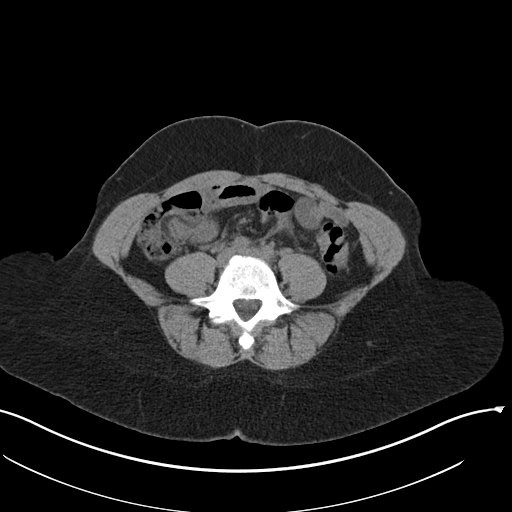
[im 49/98  soft-tissue]
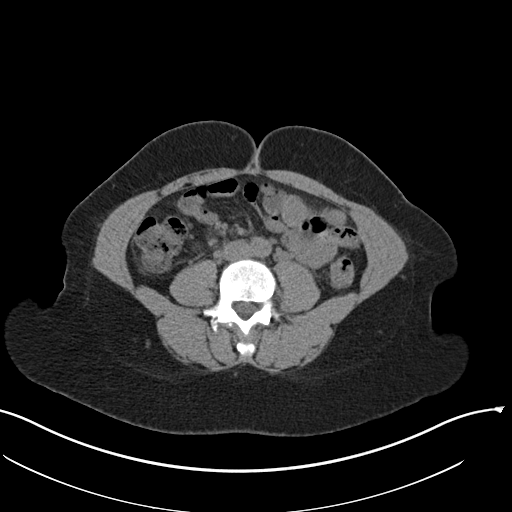
[im 54/98  soft-tissue]
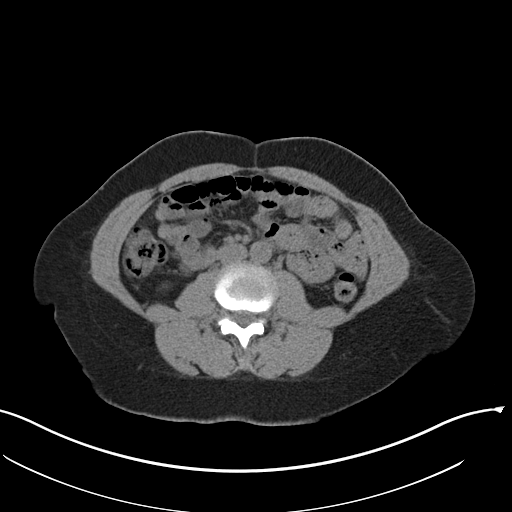
[im 65/98  soft-tissue]
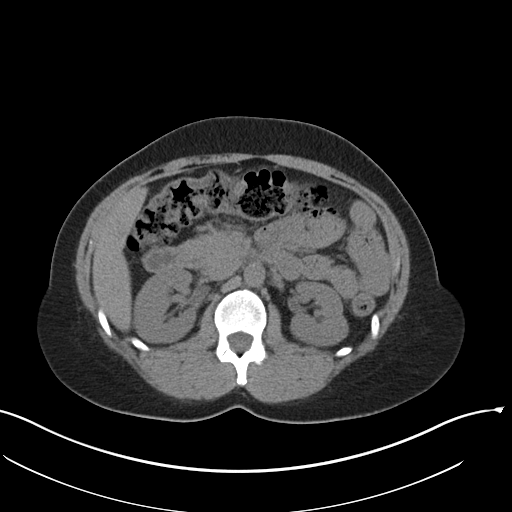
[im 65/98  bone]
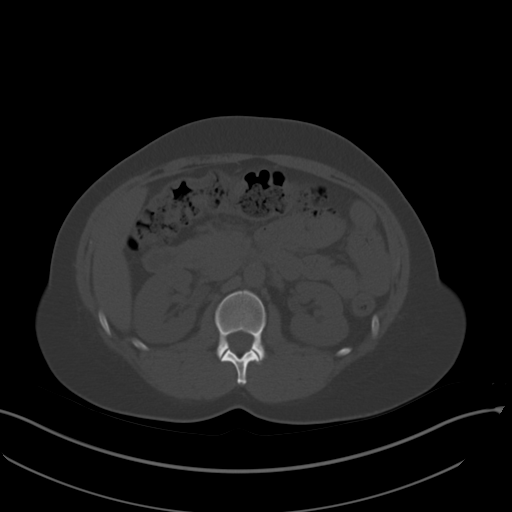
[im 71/98  soft-tissue]
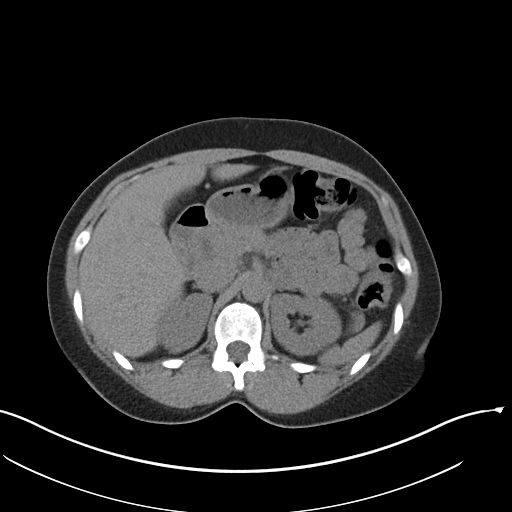
[im 76/98  soft-tissue]
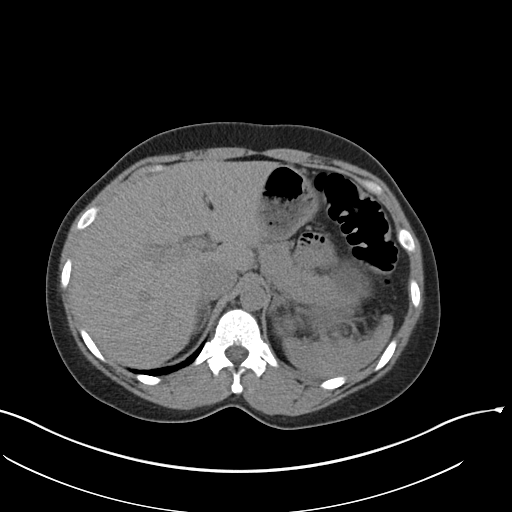
[im 87/98  soft-tissue]
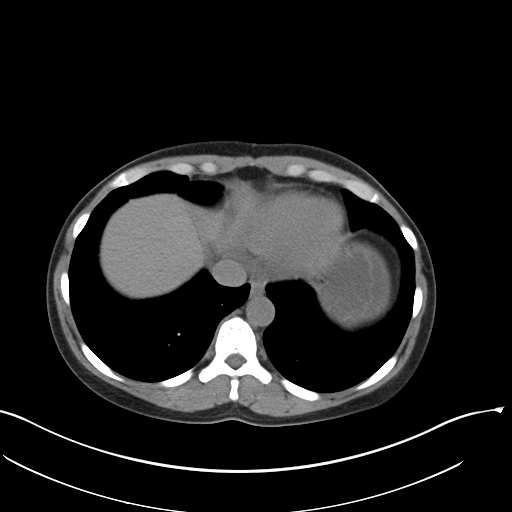
[im 92/98  soft-tissue]
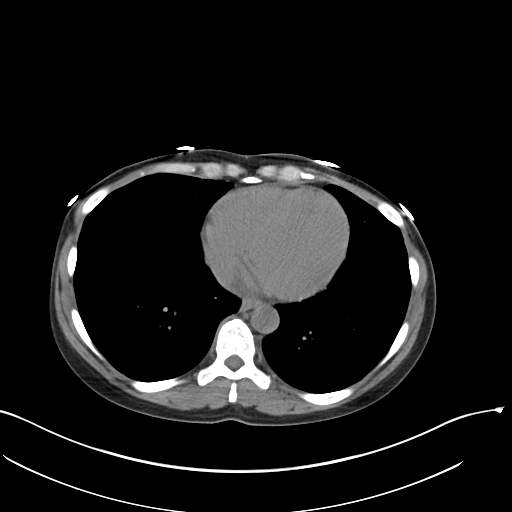

[Series 5: coronal st · coronal · 0.76mm/px · 3 of 139 slices shown]
[im 47/139  soft-tissue]
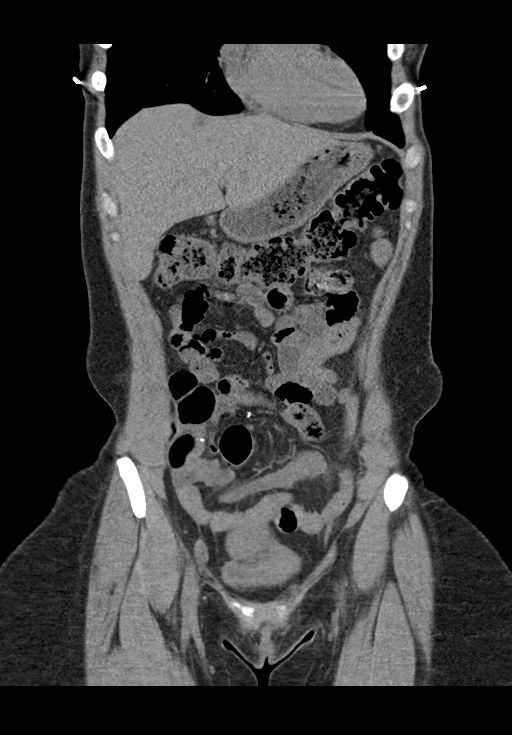
[im 62/139  soft-tissue]
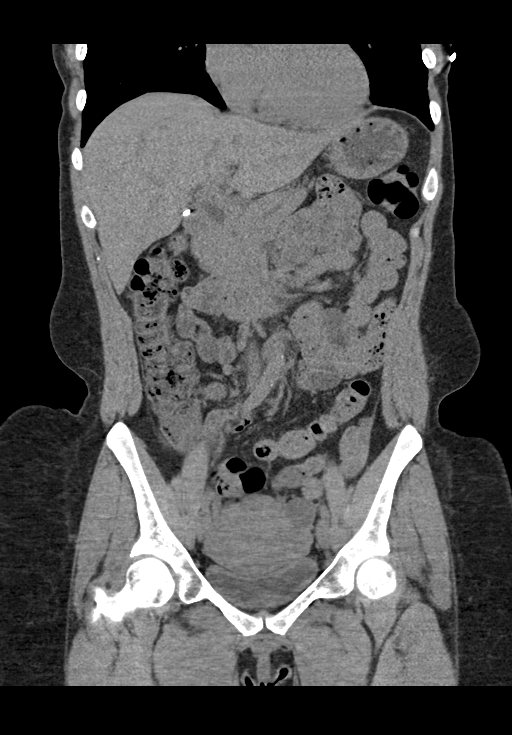
[im 77/139  soft-tissue]
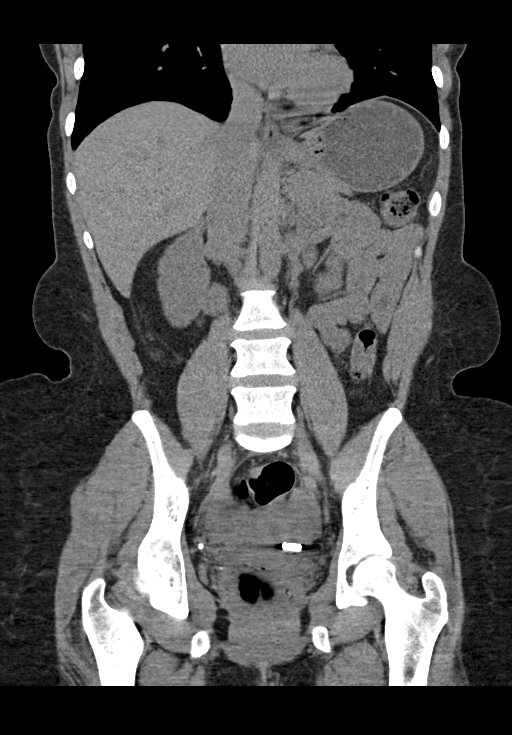

[16 of 46 positions shown; findings below may reference images not displayed]

FINDINGS: Lower chest: Included lung bases are clear.  Heart size is normal.

Hepatobiliary: Unremarkable unenhanced appearance of the liver. No
focal liver lesion identified. Gallbladder is surgically absent. No
biliary dilatation.

Pancreas: Unremarkable. No pancreatic ductal dilatation or
surrounding inflammatory changes.

Spleen: Normal in size without focal abnormality.

Adrenals/Urinary Tract: Adrenal glands are unremarkable. Kidneys are
normal, without renal calculi, focal lesion, or hydronephrosis.
Urinary bladder wall appears mildly thickened.

Stomach/Bowel: Stomach is within normal limits. Appendix is
surgically absent. No evidence of bowel wall thickening, distention,
or inflammatory changes.

Vascular/Lymphatic: Scattered aortoiliac atherosclerotic
calcifications without aneurysm. No abdominopelvic lymphadenopathy.

Reproductive: Uterus and bilateral adnexa are unremarkable.

Other: No free fluid. No abdominopelvic fluid collection. No
pneumoperitoneum. No abdominal wall hernia.

Musculoskeletal: No acute or significant osseous findings.
IMPRESSION: 1. Urinary bladder wall appears mildly thickened. Correlate with
urinalysis to exclude cystitis.
2. Otherwise, no acute abdominopelvic findings.
3. Aortic atherosclerosis ([id number]).

## 2022-08-29 ENCOUNTER — Emergency Department (HOSPITAL_COMMUNITY)
Admission: EM | Admit: 2022-08-29 | Discharge: 2022-08-29 | Disposition: A | Discharge status: Home / Self Care | Payer: Medicaid Other | Attending: Emergency Medicine | Admitting: Emergency Medicine

## 2022-08-29 ENCOUNTER — Encounter (HOSPITAL_COMMUNITY): Payer: Self-pay | Admitting: *Deleted

## 2022-08-29 ENCOUNTER — Other Ambulatory Visit: Payer: Self-pay

## 2022-08-29 DIAGNOSIS — Z79899 Other long term (current) drug therapy: Secondary | ICD-10-CM | POA: Diagnosis not present

## 2022-08-29 DIAGNOSIS — M545 Low back pain, unspecified: Secondary | ICD-10-CM | POA: Diagnosis not present

## 2022-08-29 MED ORDER — CYCLOBENZAPRINE HCL 10 MG PO TABS: 10.0000 mg | ORAL_TABLET | Freq: Two times a day (BID) | ORAL | 0 refills | Status: AC | PRN

## 2022-08-29 MED ORDER — NAPROXEN 500 MG PO TABS: 500.0000 mg | ORAL_TABLET | Freq: Two times a day (BID) | ORAL | 0 refills | Status: AC

## 2022-08-29 MED ORDER — LIDOCAINE 5 % EX PTCH
2.0000 | MEDICATED_PATCH | CUTANEOUS | Status: DC
  Administered 2022-08-29: 2 via TRANSDERMAL
  Filled 2022-08-29: qty 2

## 2022-08-29 NOTE — ED Triage Notes (Signed)
One week of nontraumatic lower back pain slightly to left side x approx a week. Has tried a variety of OTC meds, rubs, patches without relief.

## 2022-08-29 NOTE — Discharge Instructions (Addendum)
Please take your medications as prescribed. Take tylenol/ibuprofen/naproxen and flexeril for pain. I recommend close follow-up with PCP for reevaluation.  Please do not hesitate to return to emergency department if worrisome signs symptoms we discussed become apparent.

## 2022-08-29 NOTE — ED Provider Notes (Signed)
[place] EMERGENCY DEPARTMENT AT [facility] Provider Note   CSN: [id number] Arrival date & time: 08/29/22  1310     History {Add pertinent medical, surgical, social history, OB history to HPI:1} Chief Complaint  Patient presents with   Back Pain    Krista Obrien is a 47 y.o. female with no segment past medical history presents today for evaluation of lower back pain.  Patient reports she started to have lower back pain about a week ago.  She states that she woke up and the pain started.  She has tried Tylenol, ibuprofen with no relief.  She denies any injury to her back.  Denies any heavy lifting at work.  She denies fever, urinary or bowel incontinence, numbness or weakness on her lower extremities.   Back Pain   History reviewed. No pertinent past medical history. Past Surgical History:  Procedure Laterality Date   APPENDECTOMY     CHOLECYSTECTOMY       Home Medications Prior to Admission medications   Medication Sig Start Date End Date Taking? Authorizing Provider  cyclobenzaprine (FLEXERIL) 10 MG tablet Take 1 tablet (10 mg total) by mouth 2 (two) times daily as needed for muscle spasms. 08/29/22  Yes Rex Kras, PA  naproxen (NAPROSYN) 500 MG tablet Take 1 tablet (500 mg total) by mouth 2 (two) times daily. 08/29/22  Yes Rex Kras, PA  amLODipine (NORVASC) 5 MG tablet Take 1 tablet (5 mg total) by mouth daily. 01/25/21   Lacretia Leigh, MD  cephALEXin (KEFLEX) 500 MG capsule Take 1 capsule (500 mg total) by mouth 4 (four) times daily. 01/25/21   Lacretia Leigh, MD  phenazopyridine (PYRIDIUM) 200 MG tablet Take 1 tablet (200 mg total) by mouth 3 (three) times daily. 01/25/21   Lacretia Leigh, MD  predniSONE (STERAPRED UNI-PAK 21 TAB) 10 MG (21) TBPK tablet Take by mouth daily. Take 6 tabs by mouth day 1, then 5 tabs, then 4 tabs, then 3 tabs, 2 tabs, then 1 tab for the last day 08/15/19   Tasia Catchings, Amy V, PA-C  tiZANidine (ZANAFLEX) 4 MG tablet Take 1 tablet (4 mg total) by  mouth every 6 (six) hours as needed for muscle spasms. 08/15/19   Ok Edwards, PA-C      Allergies    Percocet [oxycodone-acetaminophen]    Review of Systems   Review of Systems  Musculoskeletal:  Positive for back pain.    Physical Exam Updated Vital Signs BP (!) 144/102 (BP Location: Left Arm)   Pulse 67   Temp 98.2 F (36.8 C) (Oral)   Resp 18   Ht 5' 5"$  (1.651 m)   SpO2 100%   BMI 29.62 kg/m  Physical Exam Vitals and nursing note reviewed.  Constitutional:      Appearance: Normal appearance.  HENT:     Head: Normocephalic and atraumatic.     Mouth/Throat:     Mouth: Mucous membranes are moist.  Eyes:     General: No scleral icterus. Cardiovascular:     Rate and Rhythm: Normal rate and regular rhythm.     Pulses: Normal pulses.     Heart sounds: Normal heart sounds.  Pulmonary:     Effort: Pulmonary effort is normal.     Breath sounds: Normal breath sounds.  Abdominal:     General: Abdomen is flat.     Palpations: Abdomen is soft.     Tenderness: There is no abdominal tenderness.  Musculoskeletal:  General: No deformity.     Comments: TTP to R paraspinal muscles of lower back.  Skin:    General: Skin is warm.     Findings: No rash.  Neurological:     General: No focal deficit present.     Mental Status: She is alert.  Psychiatric:        Mood and Affect: Mood normal.     ED Results / Procedures / Treatments   Labs (all labs ordered are listed, but only abnormal results are displayed) Labs Reviewed - No data to display  EKG None  Radiology No results found.  Procedures Procedures  {Document cardiac monitor, telemetry assessment procedure when appropriate:1}  Medications Ordered in ED Medications - No data to display  ED Course/ Medical Decision Making/ A&P   {   Click here for ABCD2, HEART and other calculatorsREFRESH Note before signing :1}                          Medical Decision Making Risk Prescription drug  management.   This patient presents to the ED for back pain, this involves an extensive number of treatment options, and is a complaint that carries with a high risk of complications and morbidity.  The differential diagnosis includes fracture, dislocation, musculoskeletal pain, cauda equina, kidney stone, AAA.  This is not an exhaustive list.  Problem list/ ED course/ Critical interventions/ Medical management: HPI: See above Vital signs within normal range and stable throughout visit. Laboratory/imaging studies significant for: See above. On physical examination, patient is afebrile and appears in no acute distress. This patient presents with back pain most consistent with musculoskeletal pain. Differential diagnoses includes lumbago versus musculoskeletal spasm / strain versus sciatica. Less likely sciatica as straight leg raise test was negative. No back pain red flags on history or physical. Presentation not consistent with malignancy (lack of history of malignancy, lack of B symptoms), fracture (no trauma, no bony tenderness to palpation), cauda equina (no bowel or urinary incontinence/retention, no saddle anesthesia, no distal weakness), AAA, viscus perforation, osteomyelitis or epidural abscess (no IVDU, vertebral tenderness), renal colic, pyelonephritis (afebrile, no CVAT, no urinary symptoms). Given the clinical picture, no indication for imaging at this time. Based on patient's clinical presentations and laboratory/imaging studies I suspect musculoskeletal pain. Advised patient to take Tylenol/ibuprofen/naproxen for pain, follow-up with primary care physician for further evaluation and management, return to the ER if new or worsening symptoms. I have reviewed the patient home medicines and have made adjustments as needed.  Cardiac monitoring/EKG: The patient was maintained on a cardiac monitor.  I personally reviewed and interpreted the cardiac monitor which showed an underlying rhythm of:  sinus rhythm.  Additional history obtained: External records from outside source obtained and reviewed including: Chart review including previous notes, labs, imaging.  Disposition Continued outpatient therapy. Follow-up with PCP recommended for reevaluation of symptoms. Treatment plan discussed with patient.  Pt acknowledged understanding was agreeable to the plan. Worrisome signs and symptoms were discussed with patient, and patient acknowledged understanding to return to the ED if they noticed these signs and symptoms. Patient was stable upon discharge.   This chart was dictated using voice recognition software.  Despite best efforts to proofread,  errors can occur which can change the documentation meaning.    {Document critical care time when appropriate:1} {Document review of labs and clinical decision tools ie heart score, Chads2Vasc2 etc:1}  {Document your independent review of radiology images, and any outside  records:1} {Document your discussion with family members, caretakers, and with consultants:1} {Document social determinants of health affecting pt's care:1} {Document your decision making why or why not admission, treatments were needed:1} Final Clinical Impression(s) / ED Diagnoses Final diagnoses:  Acute left-sided low back pain without sciatica    Rx / DC Orders ED Discharge Orders          Ordered    cyclobenzaprine (FLEXERIL) 10 MG tablet  2 times daily PRN        08/29/22 1439    naproxen (NAPROSYN) 500 MG tablet  2 times daily        08/29/22 1439

## 2024-03-23 ENCOUNTER — Emergency Department (HOSPITAL_COMMUNITY)
Admission: EM | Admit: 2024-03-23 | Discharge: 2024-03-23 | Disposition: A | Discharge status: Home / Self Care | Payer: Self-pay | Attending: Emergency Medicine | Admitting: Emergency Medicine

## 2024-03-23 DIAGNOSIS — M62830 Muscle spasm of back: Secondary | ICD-10-CM | POA: Insufficient documentation

## 2024-03-23 DIAGNOSIS — M5441 Lumbago with sciatica, right side: Secondary | ICD-10-CM | POA: Insufficient documentation

## 2024-03-23 DIAGNOSIS — M5410 Radiculopathy, site unspecified: Secondary | ICD-10-CM | POA: Insufficient documentation

## 2024-03-23 LAB — POC URINE PREG, ED: Preg Test, Ur: NEGATIVE

## 2024-03-23 MED ORDER — METHYLPREDNISOLONE 4 MG PO TBPK: ORAL_TABLET | ORAL | 0 refills | Status: DC

## 2024-03-23 MED ORDER — IBUPROFEN 800 MG PO TABS: 800.0000 mg | ORAL_TABLET | Freq: Once | ORAL | Status: DC

## 2024-03-23 MED ORDER — LIDOCAINE 5 % EX PTCH: 1.0000 | MEDICATED_PATCH | CUTANEOUS | 0 refills | Status: DC

## 2024-03-23 MED ORDER — ONDANSETRON 4 MG PO TBDP: 4.0000 mg | ORAL_TABLET | Freq: Three times a day (TID) | ORAL | 0 refills | Status: AC | PRN

## 2024-03-23 MED ORDER — METHYLPREDNISOLONE 4 MG PO TBPK: ORAL_TABLET | ORAL | 0 refills | Status: AC

## 2024-03-23 MED ORDER — CYCLOBENZAPRINE HCL 10 MG PO TABS: 10.0000 mg | ORAL_TABLET | Freq: Two times a day (BID) | ORAL | 0 refills | Status: AC | PRN

## 2024-03-23 MED ORDER — IBUPROFEN 800 MG PO TABS
800.0000 mg | ORAL_TABLET | Freq: Once | ORAL | Status: AC
  Administered 2024-03-23: 800 mg via ORAL
  Filled 2024-03-23: qty 1

## 2024-03-23 NOTE — ED Triage Notes (Signed)
 Patient in today reporting right lower back going down right leg x4 days.

## 2024-03-23 NOTE — ED Notes (Signed)
Pt given urine cup to provide specimen. 

## 2024-03-23 NOTE — ED Provider Notes (Signed)
 [place] EMERGENCY DEPARTMENT AT [facility] Provider Note   CSN: [id number] Arrival date & time: 03/23/24  9192     Patient presents with: Back Pain   Krista Obrien is a 48 y.o. female.   The history is provided by the patient and medical records. No language interpreter was used.  Back Pain Location:  Sacro-iliac joint and lumbar spine (R paraspinal pain and spasm) Quality:  Cramping Radiates to:  R posterior upper leg Pain severity:  Severe Pain is:  Unable to specify Onset quality:  Gradual Duration:  1 week Timing:  Constant Progression:  Waxing and waning Chronicity:  Recurrent Context: not falling, not lifting heavy objects, not recent injury and not twisting   Relieved by:  Nothing Worsened by:  Twisting and bending Ineffective treatments:  None tried Associated symptoms: no abdominal pain, no bladder incontinence, no bowel incontinence, no chest pain, no dysuria, no fever, no headaches, no numbness, no paresthesias, no perianal numbness, no tingling and no weakness        Prior to Admission medications   Medication Sig Start Date End Date Taking? Authorizing Provider  amLODipine  (NORVASC ) 5 MG tablet Take 1 tablet (5 mg total) by mouth daily. 01/25/21   Dasie Faden, MD  cephALEXin  (KEFLEX ) 500 MG capsule Take 1 capsule (500 mg total) by mouth 4 (four) times daily. 01/25/21   Dasie Faden, MD  cyclobenzaprine  (FLEXERIL ) 10 MG tablet Take 1 tablet (10 mg total) by mouth 2 (two) times daily as needed for muscle spasms. 08/29/22   Ladora Congress, PA  naproxen  (NAPROSYN ) 500 MG tablet Take 1 tablet (500 mg total) by mouth 2 (two) times daily. 08/29/22   Ladora Congress, PA  phenazopyridine  (PYRIDIUM ) 200 MG tablet Take 1 tablet (200 mg total) by mouth 3 (three) times daily. 01/25/21   Dasie Faden, MD  predniSONE  (STERAPRED UNI-PAK 21 TAB) 10 MG (21) TBPK tablet Take by mouth daily. Take 6 tabs by mouth day 1, then 5 tabs, then 4 tabs, then 3 tabs, 2 tabs, then 1 tab  for the last day 08/15/19   Babara Greig GAILS, PA-C  tiZANidine  (ZANAFLEX ) 4 MG tablet Take 1 tablet (4 mg total) by mouth every 6 (six) hours as needed for muscle spasms. 08/15/19   Babara Greig GAILS, PA-C    Allergies: Percocet [oxycodone-acetaminophen ]    Review of Systems  Constitutional:  Negative for chills, fatigue and fever.  HENT:  Negative for congestion.   Respiratory:  Negative for cough, chest tightness, shortness of breath and wheezing.   Cardiovascular:  Negative for chest pain.  Gastrointestinal:  Negative for abdominal pain, bowel incontinence, constipation, diarrhea, nausea and vomiting.  Genitourinary:  Negative for bladder incontinence, dysuria, flank pain and frequency.  Musculoskeletal:  Positive for back pain. Negative for neck pain.  Skin:  Negative for rash and wound.  Neurological:  Negative for tingling, weakness, light-headedness, numbness, headaches and paresthesias.  Psychiatric/Behavioral:  Negative for agitation and confusion.   All other systems reviewed and are negative.   Updated Vital Signs BP (!) 147/100 (BP Location: Right Arm)   Pulse 74   Temp 98.1 F (36.7 C) (Oral)   Resp 18   SpO2 100%   Physical Exam Vitals and nursing note reviewed.  Constitutional:      General: She is not in acute distress.    Appearance: She is well-developed. She is not ill-appearing, toxic-appearing or diaphoretic.  HENT:     Head: Normocephalic and atraumatic.  Mouth/Throat:     Mouth: Mucous membranes are moist.  Eyes:     Conjunctiva/sclera: Conjunctivae normal.  Cardiovascular:     Rate and Rhythm: Normal rate and regular rhythm.     Heart sounds: No murmur heard. Pulmonary:     Effort: Pulmonary effort is normal. No respiratory distress.     Breath sounds: Normal breath sounds. No wheezing, rhonchi or rales.  Chest:     Chest wall: No tenderness.  Abdominal:     General: Abdomen is flat.     Palpations: Abdomen is soft.     Tenderness: There is no abdominal  tenderness. There is no right CVA tenderness, left CVA tenderness, guarding or rebound.  Musculoskeletal:        General: Tenderness present. No swelling.     Cervical back: Neck supple.     Lumbar back: Spasms and tenderness present. No signs of trauma or bony tenderness. Positive right straight leg raise test.       Back:     Comments: Tenderness and muscle spasm in right low paraspinal back.  No numbness or weakness on exam.  Positive straight leg raise.  Intact pulses.  No rashes or shingles.  Skin:    General: Skin is warm and dry.     Capillary Refill: Capillary refill takes less than 2 seconds.     Findings: No erythema or rash.  Neurological:     General: No focal deficit present.     Mental Status: She is alert.     Sensory: No sensory deficit.     Motor: No weakness.  Psychiatric:        Mood and Affect: Mood normal.     (all labs ordered are listed, but only abnormal results are displayed) Labs Reviewed  POC URINE PREG, ED    EKG: None  Radiology: No results found.   Procedures   Medications Ordered in the ED  ibuprofen  (ADVIL ) tablet 800 mg (800 mg Oral Given 03/23/24 0936)                                    Medical Decision Making Risk Prescription drug management.    Krista Obrien is a 48 y.o. female with a past medical history significant for appendectomy, cholecystectomy, and previous sciatic pain in her back going down the right leg who presents with low back pain.  According to patient, for the last week she has had pain again in her right low back with spasms radiating down her right leg.  She reports it feels similar to the last several time she has had this.  She reports that previously she got a steroid taper and muscle relaxants and it improved.  She has never seen a back doctor for it and denies any trauma.  She denies any falls or injuries.  She denies any loss of bowel or bladder control and denies any numbness, tingling, or weakness in the  leg itself.  She denies any skin changes or rash.  She denies any swelling of the leg.  Denies any other complaints reports the pain is moderate to severe.  She did report some mild nausea the other day but is not having it now.  On exam, lungs clear.  Chest nontender.  Abdomen nontender.  Patient moving extremities normally.  She did have a positive straight leg raise on the right and she had spasm and tenderness on  her low back on the right side.  She had no skin changes or rash to suggest shingles.  No neurologic deficits with it.  No numbness or weakness.  Lungs clear.  Exam otherwise unremarkable.  We had a shared decision-making conversation about management.  We offered to do imaging or labs or urine but patient says she has no other symptoms and would like to instead be symptomatically treated.  She is amenable to getting a pregnancy test as she report she has not had a menstrual cycle in 7 months but does not want other workup.  If pregnancy test is negative, will give ibuprofen  now but as she drove will hold another medicines.  Will give her prescription for muscle relaxant Lidoderm  patches and a steroid taper as this has helped in the past.  She will also be given instructions to follow-up with a back doctor if it does not improve.  She does not want imaging and given her lack of any numbness, weakness, or other red flags such as urinary changes, will hold on other workup.  Patient agrees with this plan.    Anticipate discharge after pregnancy test.             9:35 AM Pregnancy test negative.  Will give the ibuprofen  as she requested and give prescription for medications and outpatient follow-up instructions.  We agreed to hold on extensive lab and imaging workup after offering it.  She had no other questions or concerns and was discharged in good condition.      Final diagnoses:  Radicular pain of right lower extremity  Acute right-sided low back pain with right-sided sciatica   Muscle spasm of back    ED Discharge Orders          Ordered    methylPREDNISolone  (MEDROL  DOSEPAK) 4 MG TBPK tablet        03/23/24 0934    lidocaine  (LIDODERM ) 5 %  Every 24 hours        03/23/24 0934    cyclobenzaprine  (FLEXERIL ) 10 MG tablet  2 times daily PRN        03/23/24 0934    ondansetron  (ZOFRAN -ODT) 4 MG disintegrating tablet  Every 8 hours PRN        03/23/24 0934           Clinical Impression: 1. Radicular pain of right lower extremity   2. Acute right-sided low back pain with right-sided sciatica   3. Muscle spasm of back     Disposition: Discharge  Condition: Good  I have discussed the results, Dx and Tx plan with the pt(& family if present). He/she/they expressed understanding and agree(s) with the plan. Discharge instructions discussed at great length. Strict return precautions discussed and pt &/or family have verbalized understanding of the instructions. No further questions at time of discharge.    New Prescriptions   CYCLOBENZAPRINE  (FLEXERIL ) 10 MG TABLET    Take 1 tablet (10 mg total) by mouth 2 (two) times daily as needed for muscle spasms.   LIDOCAINE  (LIDODERM ) 5 %    Place 1 patch onto the skin daily. Remove & Discard patch within 12 hours or as directed by MD   METHYLPREDNISOLONE  (MEDROL  DOSEPAK) 4 MG TBPK TABLET    Please follow directions on Dosepak   ONDANSETRON  (ZOFRAN -ODT) 4 MG DISINTEGRATING TABLET    Take 1 tablet (4 mg total) by mouth every 8 (eight) hours as needed for nausea or vomiting.    Follow Up: [place], [facility] [address] [place] [address] [phone]     [facility] Emergency Department at [facility] [address] [place]  [address] [phone]        Aryam Zhan, Lonni PARAS, MD 03/23/24 [phone]

## 2024-03-23 NOTE — Discharge Instructions (Signed)
 Your history, exam, and workup today are consistent with a nerve pain called radicular pain going down the right leg from the back.  You had spasm and tenderness on exam and we had a shared decision making conversation about workup and agreed to treat you with medications and have you follow-up with a back doctor and primary doctor.  We agreed to hold on extensive lab and imaging workup given your history of similar symptoms and otherwise reassuring exam.  If any symptoms change or worsen acutely, please return to the nearest emergency department for further workup.  Please rest and stay hydrated.

## 2024-06-20 ENCOUNTER — Emergency Department (HOSPITAL_COMMUNITY): Payer: Self-pay

## 2024-06-20 ENCOUNTER — Emergency Department (HOSPITAL_COMMUNITY)
Admission: EM | Admit: 2024-06-20 | Discharge: 2024-06-20 | Disposition: A | Discharge status: Home / Self Care | Payer: Self-pay | Attending: Emergency Medicine | Admitting: Emergency Medicine

## 2024-06-20 DIAGNOSIS — M5412 Radiculopathy, cervical region: Secondary | ICD-10-CM | POA: Insufficient documentation

## 2024-06-20 DIAGNOSIS — M65311 Trigger thumb, right thumb: Secondary | ICD-10-CM | POA: Insufficient documentation

## 2024-06-20 DIAGNOSIS — M25511 Pain in right shoulder: Secondary | ICD-10-CM

## 2024-06-20 DIAGNOSIS — Z720 Tobacco use: Secondary | ICD-10-CM | POA: Insufficient documentation

## 2024-06-20 DIAGNOSIS — Z79899 Other long term (current) drug therapy: Secondary | ICD-10-CM | POA: Insufficient documentation

## 2024-06-20 MED ORDER — METHOCARBAMOL 500 MG PO TABS: 500.0000 mg | ORAL_TABLET | Freq: Two times a day (BID) | ORAL | 0 refills | Status: DC

## 2024-06-20 MED ORDER — KETOROLAC TROMETHAMINE 15 MG/ML IJ SOLN
15.0000 mg | Freq: Once | INTRAMUSCULAR | Status: AC
  Administered 2024-06-20: 15 mg via INTRAMUSCULAR
  Filled 2024-06-20: qty 1

## 2024-06-20 MED ORDER — FENTANYL CITRATE (PF) 50 MCG/ML IJ SOSY
50.0000 ug | PREFILLED_SYRINGE | Freq: Once | INTRAMUSCULAR | Status: AC
  Administered 2024-06-20: 50 ug via INTRAMUSCULAR
  Filled 2024-06-20: qty 1

## 2024-06-20 MED ORDER — METHOCARBAMOL 500 MG PO TABS: 500.0000 mg | ORAL_TABLET | Freq: Two times a day (BID) | ORAL | 0 refills | Status: AC

## 2024-06-20 MED ORDER — PREDNISONE 10 MG (21) PO TBPK: ORAL_TABLET | Freq: Every day | ORAL | 0 refills | Status: DC

## 2024-06-20 MED ORDER — PREDNISONE 10 MG (21) PO TBPK: ORAL_TABLET | Freq: Every day | ORAL | 0 refills | Status: AC

## 2024-06-20 MED ORDER — LIDOCAINE 5 % EX PTCH: 1.0000 | MEDICATED_PATCH | CUTANEOUS | 0 refills | Status: AC

## 2024-06-20 MED ORDER — LIDOCAINE 5 % EX PTCH: 1.0000 | MEDICATED_PATCH | CUTANEOUS | 0 refills | Status: DC

## 2024-06-20 NOTE — ED Notes (Signed)
 Pt in xray

## 2024-06-20 NOTE — Discharge Instructions (Addendum)
 Please use Tylenol  or ibuprofen  for pain.  You may use 600 mg ibuprofen  every 6 hours or 1000 mg of Tylenol  every 6 hours.  You may choose to alternate between the 2.  This would be most effective.  Not to exceed 4 g of Tylenol  within 24 hours.  Not to exceed 3200 mg ibuprofen  24 hours.  You can use the muscle relaxant I am prescribing in addition to the above to help with any breakthrough pain.  You can take it up to twice daily.  It is safe to take at night, but I would be cautious taking it during the day as it can cause some drowsiness.  Make sure that you are feeling awake and alert before you get behind the wheel of a car or operate a motor vehicle.  It is not a narcotic pain medication so you are able to take it if it is not making you drowsy and still pilot a vehicle or machinery safely.  Follow up with the orthopedic surgeon whose contact information I provided above.

## 2024-06-20 NOTE — ED Provider Notes (Signed)
 [place] EMERGENCY DEPARTMENT AT [facility] Provider Note   CSN: [id number] Arrival date & time: 06/20/24  1203     Patient presents with: Back Pain and Finger Injury   Krista Obrien is a 48 y.o. female with past medical history significant for tobacco use who presents concern for 2 days of neck pain, right shoulder pain without known injury.  She reports that she has had something similar in the past but never this severe.  She also endorses some clicking, popping and pain of the left thumb.  She works as a financial risk analyst.  No recent fall, injury.  No history of IV drug use, no history of cancer.  Has tried muscle relaxants, Tylenol  without significant relief    Back Pain      Prior to Admission medications   Medication Sig Start Date End Date Taking? Authorizing Provider  lidocaine  (LIDODERM ) 5 % Place 1 patch onto the skin daily. Remove & Discard patch within 12 hours or as directed by MD 06/20/24  Yes Trygg Mantz H, PA-C  methocarbamol  (ROBAXIN ) 500 MG tablet Take 1 tablet (500 mg total) by mouth 2 (two) times daily. 06/20/24  Yes Sunita Demond H, PA-C  predniSONE  (STERAPRED UNI-PAK 21 TAB) 10 MG (21) TBPK tablet Take by mouth daily. Take 6 tabs by mouth daily  for 2 days, then 5 tabs for 2 days, then 4 tabs for 2 days, then 3 tabs for 2 days, 2 tabs for 2 days, then 1 tab by mouth daily for 2 days 06/20/24  Yes Jhordan Kinter H, PA-C  amLODipine  (NORVASC ) 5 MG tablet Take 1 tablet (5 mg total) by mouth daily. 01/25/21   Dasie Faden, MD  cephALEXin  (KEFLEX ) 500 MG capsule Take 1 capsule (500 mg total) by mouth 4 (four) times daily. 01/25/21   Dasie Faden, MD  cyclobenzaprine  (FLEXERIL ) 10 MG tablet Take 1 tablet (10 mg total) by mouth 2 (two) times daily as needed for muscle spasms. 08/29/22   Ladora Congress, PA  cyclobenzaprine  (FLEXERIL ) 10 MG tablet Take 1 tablet (10 mg total) by mouth 2 (two) times daily as needed for muscle spasms. 03/23/24   Tegeler, Lonni PARAS, MD  methylPREDNISolone  (MEDROL  DOSEPAK) 4 MG TBPK tablet Please follow directions on Dosepak 03/23/24   Tegeler, Lonni PARAS, MD  naproxen  (NAPROSYN ) 500 MG tablet Take 1 tablet (500 mg total) by mouth 2 (two) times daily. 08/29/22   Ladora Congress, PA  ondansetron  (ZOFRAN -ODT) 4 MG disintegrating tablet Take 1 tablet (4 mg total) by mouth every 8 (eight) hours as needed for nausea or vomiting. 03/23/24   Tegeler, Lonni PARAS, MD  phenazopyridine  (PYRIDIUM ) 200 MG tablet Take 1 tablet (200 mg total) by mouth 3 (three) times daily. 01/25/21   Dasie Faden, MD  tiZANidine  (ZANAFLEX ) 4 MG tablet Take 1 tablet (4 mg total) by mouth every 6 (six) hours as needed for muscle spasms. 08/15/19   Babara Greig GAILS, PA-C    Allergies: Percocet [oxycodone-acetaminophen ]    Review of Systems  Musculoskeletal:  Positive for back pain.  All other systems reviewed and are negative.   Updated Vital Signs BP (!) 130/94 (BP Location: Right Arm)   Pulse 80   Temp 98.5 F (36.9 C) (Oral)   Resp 18   LMP  (Within Months)   SpO2 100%   Physical Exam Vitals and nursing note reviewed.  Constitutional:      General: She is not in acute distress.    Appearance: Normal appearance.  HENT:     Head: Normocephalic and atraumatic.  Eyes:     General:        Right eye: No discharge.        Left eye: No discharge.  Cardiovascular:     Rate and Rhythm: Normal rate and regular rhythm.  Pulmonary:     Effort: Pulmonary effort is normal. No respiratory distress.  Musculoskeletal:        General: No deformity.     Comments: Focal ttp in cervical midline spine, paraspinous muscles on right, as well as across right shoulder humeral head, no stepoff, deformity, normal range of motion of the right upper extremity.  Patient also with some tenderness with normal range of motion of flexion, extension of the left thumb, she is quite tender on the volar aspect of the palm at the base of the thumb, she reports clicking, popping  sensation, suspect trigger finger.  Skin:    General: Skin is warm and dry.  Neurological:     Mental Status: She is alert and oriented to person, place, and time.  Psychiatric:        Mood and Affect: Mood normal.        Behavior: Behavior normal.     (all labs ordered are listed, but only abnormal results are displayed) Labs Reviewed - No data to display  EKG: None  Radiology: DG Hand Complete Left Result Date: 06/20/2024 CLINICAL DATA:  Left thumb pain.  No injury. EXAM: LEFT HAND - COMPLETE 3+ VIEW COMPARISON:  None Available. FINDINGS: Bone alignment and mineralization is normal. No significant degenerative changes. No acute fracture or dislocation. No bony erosions. IMPRESSION: No acute findings. Electronically Signed   By: Toribio Agreste M.D.   On: 06/20/2024 14:03   DG Shoulder Right Result Date: 06/20/2024 CLINICAL DATA:  Right shoulder pain.  No injury. EXAM: RIGHT SHOULDER - 2+ VIEW COMPARISON:  02/19/2019 FINDINGS: There is no evidence of fracture or dislocation. There is no evidence of arthropathy or other focal bone abnormality. Soft tissues are unremarkable. IMPRESSION: Negative. Electronically Signed   By: Toribio Agreste M.D.   On: 06/20/2024 14:01   CT Cervical Spine Wo Contrast Result Date: 06/20/2024 EXAM: CT CERVICAL SPINE WITHOUT CONTRAST 06/20/2024 12:49:00 PM TECHNIQUE: CT of the cervical spine was performed without the administration of intravenous contrast. Multiplanar reformatted images are provided for review. Automated exposure control, iterative reconstruction, and/or weight based adjustment of the mA/kV was utilized to reduce the radiation dose to as low as reasonably achievable. COMPARISON: None available. CLINICAL HISTORY: Cervical radiculopathy, no red flags. Back pain and left thumb pain. FINDINGS: CERVICAL SPINE: BONES AND ALIGNMENT: Broad reversal of the normal cervical lordosis. No listhesis. No acute fracture or suspicious lesion. DEGENERATIVE CHANGES:  Mild cervical spondylosis with anterior vertebral spurring at C5-C6. No evidence of high grade spinal canal or neural foraminal stenosis. SOFT TISSUES: No prevertebral soft tissue swelling. IMPRESSION: 1. No acute osseous abnormality. 2. Mild cervical spondylosis without evidence of high-grade stenosis. Electronically signed by: Dasie Hamburg MD 06/20/2024 01:19 PM EST RP Workstation: [id number]     Procedures   Medications Ordered in the ED  fentaNYL  (SUBLIMAZE ) injection 50 mcg (50 mcg Intramuscular Given 06/20/24 1320)  ketorolac  (TORADOL ) 15 MG/ML injection 15 mg (15 mg Intramuscular Given 06/20/24 1320)  Medical Decision Making Amount and/or Complexity of Data Reviewed Radiology: ordered.  Risk Prescription drug management.   This patient is a 48 y.o. female who presents to the ED for concern of neck pain, shoulder pain, left thumb pain.   Differential diagnoses prior to evaluation: Fracture, dislocation, muscle strain, spasm, cervical radiculopathy, trigger finger, versus other  Past Medical History / Social History / Additional history: Chart reviewed. Pertinent results include: Bacot use  Physical Exam: Physical exam performed. The pertinent findings include: Focal ttp in cervical midline spine, paraspinous muscles on right, as well as across right shoulder humeral head, no stepoff, deformity, normal range of motion of the right upper extremity.  Patient also with some tenderness with normal range of motion of flexion, extension of the left thumb, she is quite tender on the volar aspect of the palm at the base of the thumb, she reports clicking, popping sensation, suspect trigger finger.   Medications / Treatment: Toradol , fentanyl  Patient reports only moderate improvement  I independently interpreted imaging including CT cervical spine, plain films of right shoulder, right hand which shows  1. No acute osseous abnormality.  2. Mild  cervical spondylosis without evidence of high-grade stenosis.  Plain film right graphs of right shoulder, left hand with no evidence of acute abnormality. I agree with the radiologist interpretation.   Suspect patient may be having some mild cervical radiculopathy in context of the noted spondylosis, she is having some acute muscle spasms, pain in the left thumb appears clinically consistent with trigger finger.  Will discharge with course of muscle relaxants, lidocaine  patches, steroids, and orthopedic follow-up.  Disposition: After consideration of the diagnostic results and the patients response to treatment, I feel that patient is stable for for discharge with plan as above .   emergency department workup does not suggest an emergent condition requiring admission or immediate intervention beyond what has been performed at this time. The plan is: as above. The patient is safe for discharge and has been instructed to return immediately for worsening symptoms, change in symptoms or any other concerns.   Final diagnoses:  Cervical radiculopathy  Acute pain of right shoulder  Trigger finger of right thumb    ED Discharge Orders          Ordered    methocarbamol  (ROBAXIN ) 500 MG tablet  2 times daily        06/20/24 1432    predniSONE  (STERAPRED UNI-PAK 21 TAB) 10 MG (21) TBPK tablet  Daily        06/20/24 1432    lidocaine  (LIDODERM ) 5 %  Every 24 hours        06/20/24 1440               Breana Litts, Hugo H, PA-C 06/20/24 1440    Laurice Maude BROCKS, MD 06/21/24 1609

## 2024-06-20 NOTE — ED Triage Notes (Signed)
 Pt c/o two days of back pain without known injury. Pt also c/o L thumb pain as well.
